# Patient Record
Sex: Male | Born: 1970 | Race: White | Hispanic: No | Marital: Single | Smoking: Current every day smoker
Health system: Southern US, Community
[De-identification: ages and names within clinical notes are randomized; demographics above are authoritative.]

## PROBLEM LIST (undated history)

## (undated) DIAGNOSIS — K219 Gastro-esophageal reflux disease without esophagitis: Secondary | ICD-10-CM

## (undated) DIAGNOSIS — E039 Hypothyroidism, unspecified: Secondary | ICD-10-CM

## (undated) DIAGNOSIS — T148XXA Other injury of unspecified body region, initial encounter: Secondary | ICD-10-CM

## (undated) DIAGNOSIS — J302 Other seasonal allergic rhinitis: Secondary | ICD-10-CM

## (undated) HISTORY — PX: NO PAST SURGERIES: SHX2092

---

## 2001-08-23 ENCOUNTER — Emergency Department (HOSPITAL_COMMUNITY): Admission: EM | Admit: 2001-08-23 | Discharge: 2001-08-23 | Payer: Self-pay | Admitting: Internal Medicine

## 2001-09-03 ENCOUNTER — Emergency Department (HOSPITAL_COMMUNITY): Admission: EM | Admit: 2001-09-03 | Discharge: 2001-09-03 | Payer: Self-pay | Admitting: Emergency Medicine

## 2001-09-03 ENCOUNTER — Encounter: Payer: Self-pay | Admitting: Emergency Medicine

## 2002-04-03 ENCOUNTER — Emergency Department (HOSPITAL_COMMUNITY): Admission: EM | Admit: 2002-04-03 | Discharge: 2002-04-03 | Payer: Self-pay | Admitting: Internal Medicine

## 2007-07-15 ENCOUNTER — Emergency Department (HOSPITAL_COMMUNITY): Admission: EM | Admit: 2007-07-15 | Discharge: 2007-07-15 | Payer: Self-pay | Admitting: *Deleted

## 2007-08-24 ENCOUNTER — Emergency Department (HOSPITAL_COMMUNITY): Admission: EM | Admit: 2007-08-24 | Discharge: 2007-08-24 | Payer: Self-pay | Admitting: Emergency Medicine

## 2007-09-18 ENCOUNTER — Emergency Department (HOSPITAL_COMMUNITY): Admission: EM | Admit: 2007-09-18 | Discharge: 2007-09-18 | Payer: Self-pay | Admitting: Emergency Medicine

## 2007-10-18 ENCOUNTER — Emergency Department (HOSPITAL_COMMUNITY): Admission: EM | Admit: 2007-10-18 | Discharge: 2007-10-18 | Payer: Self-pay | Admitting: Emergency Medicine

## 2007-11-01 ENCOUNTER — Emergency Department (HOSPITAL_COMMUNITY): Admission: EM | Admit: 2007-11-01 | Discharge: 2007-11-01 | Payer: Self-pay | Admitting: Emergency Medicine

## 2007-11-02 IMAGING — CR DG ANKLE COMPLETE 3+V*R*
3 series · 3 of 3 positions shown · non-contrast
Comparison: None.

CLINICAL DATA: Twisted right ankle, lateral pain and swelling.

RIGHT ANKLE - 3 VIEW  08/24/2007:

[view not recorded (1 of 3)]
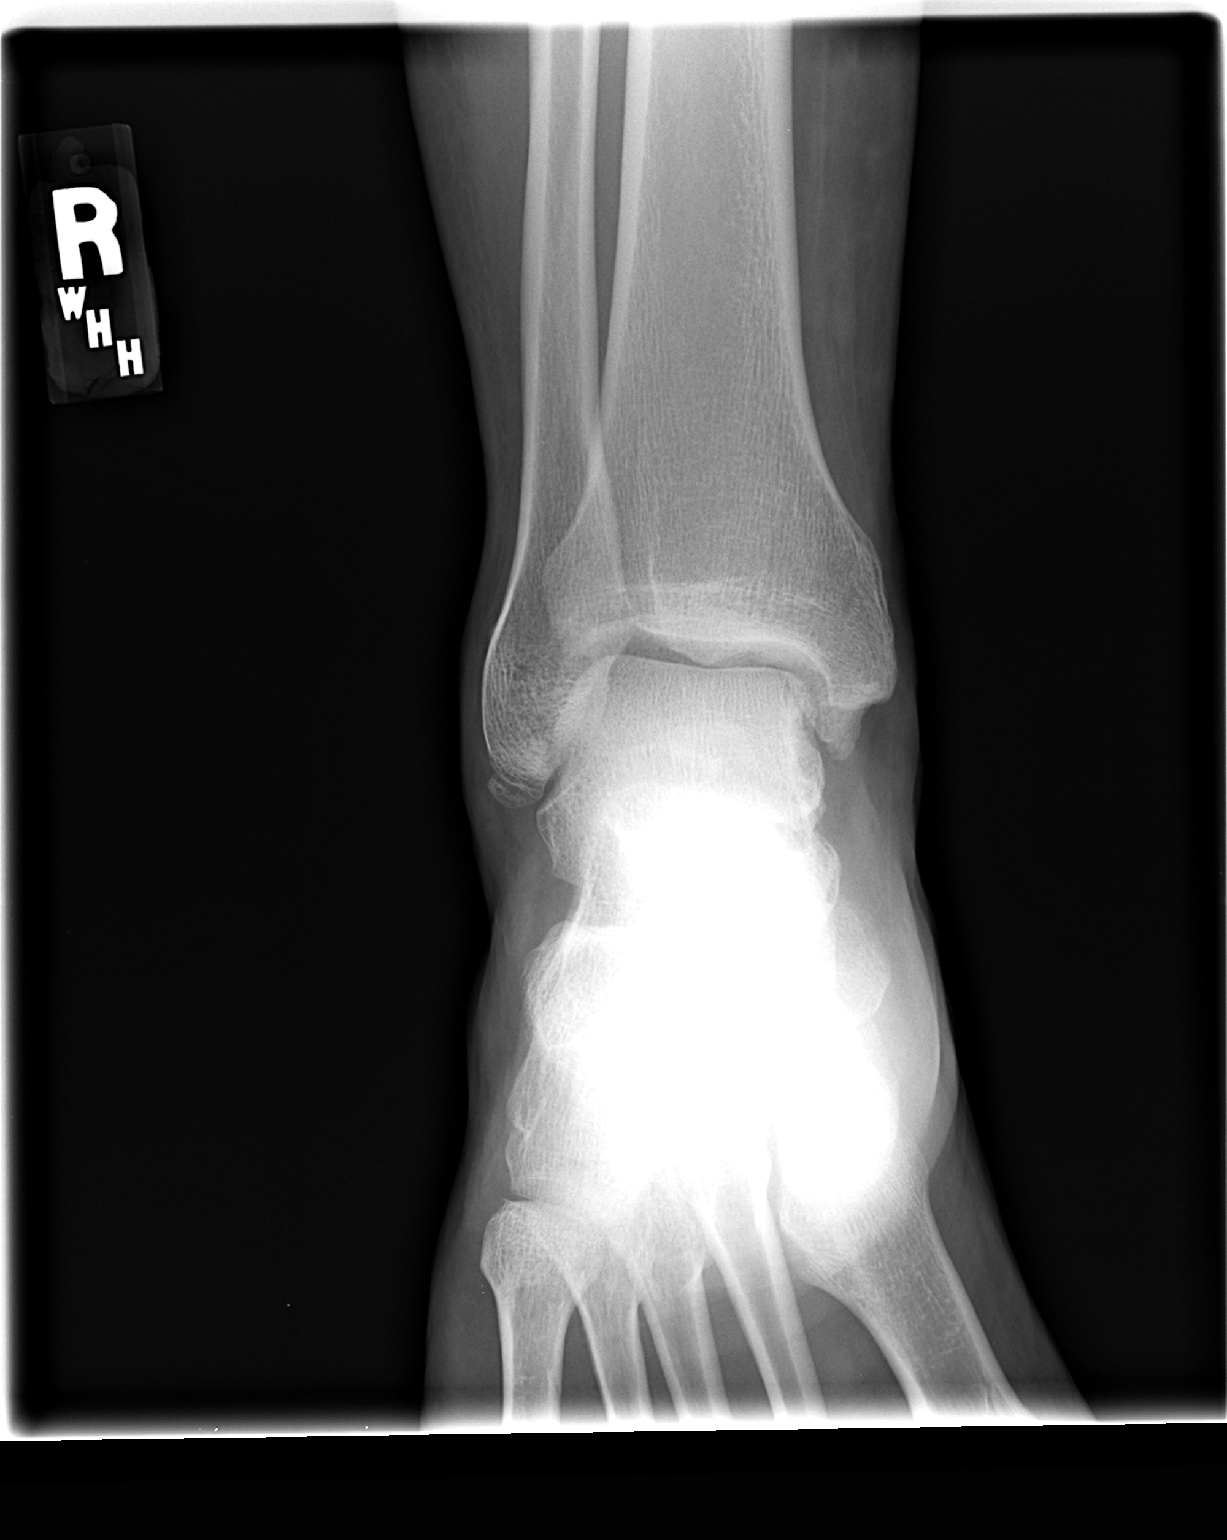

[view not recorded (2 of 3)]
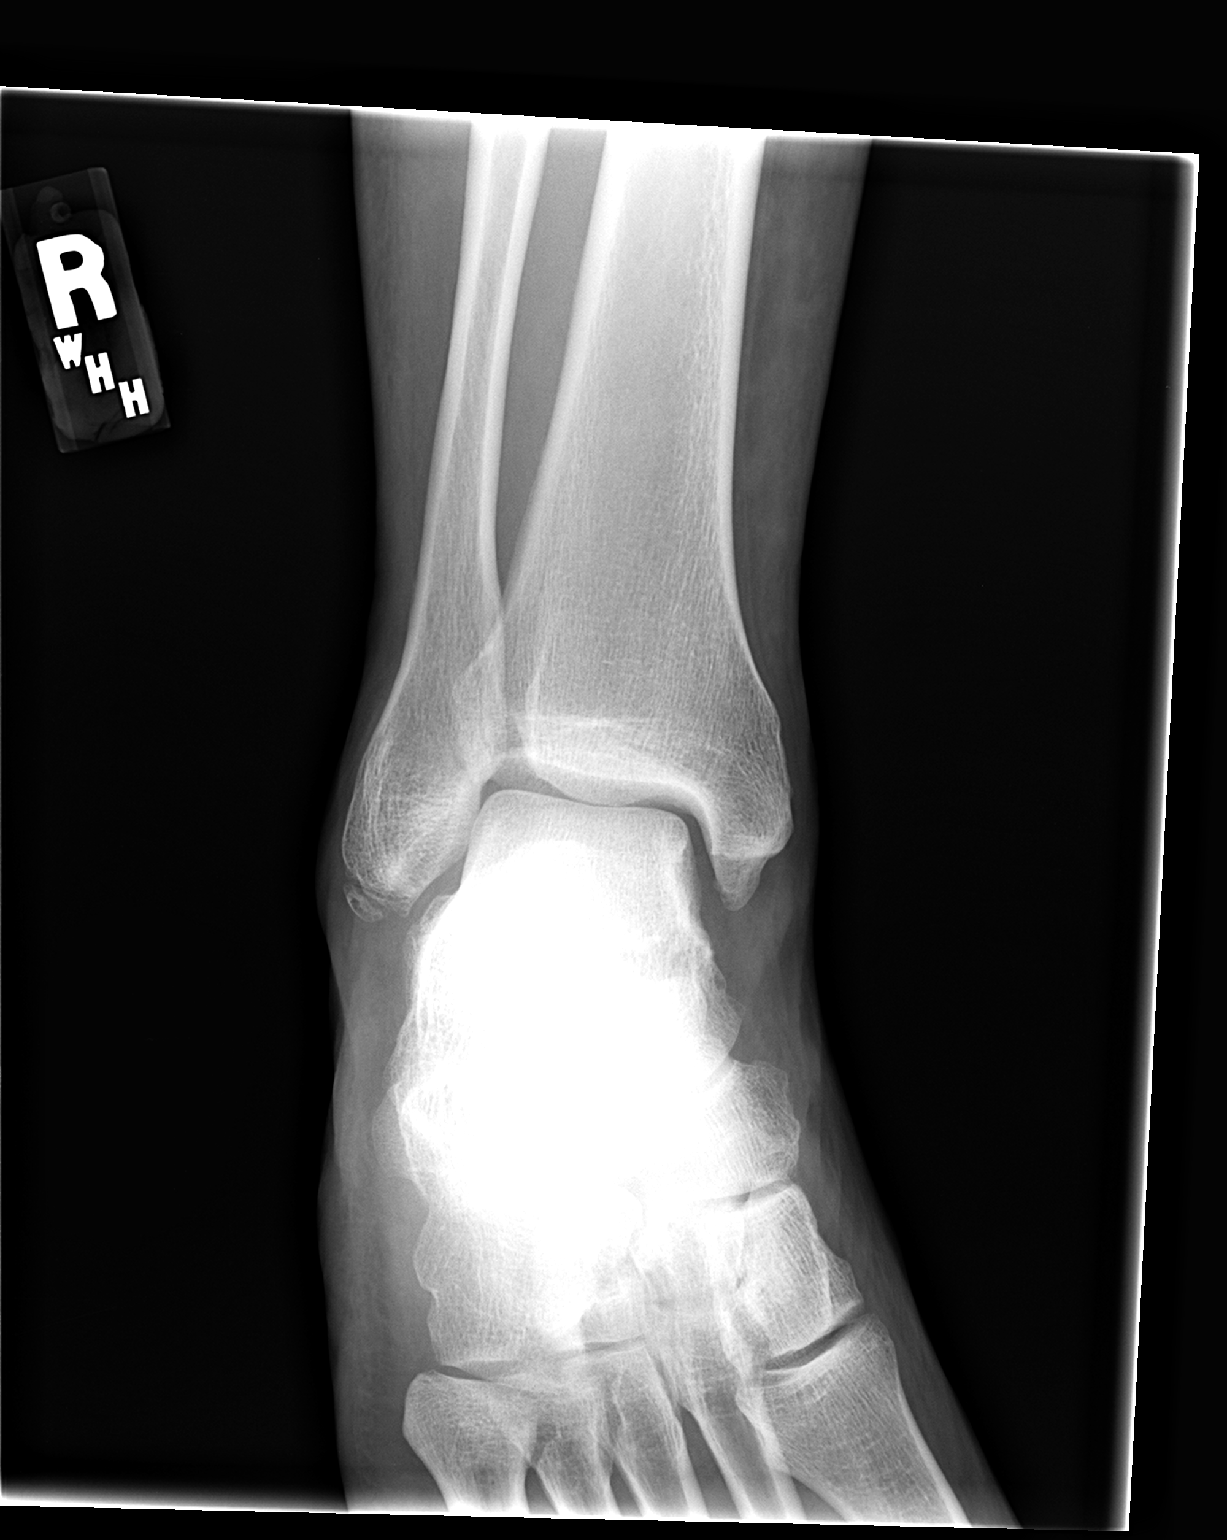

[view not recorded (3 of 3)]
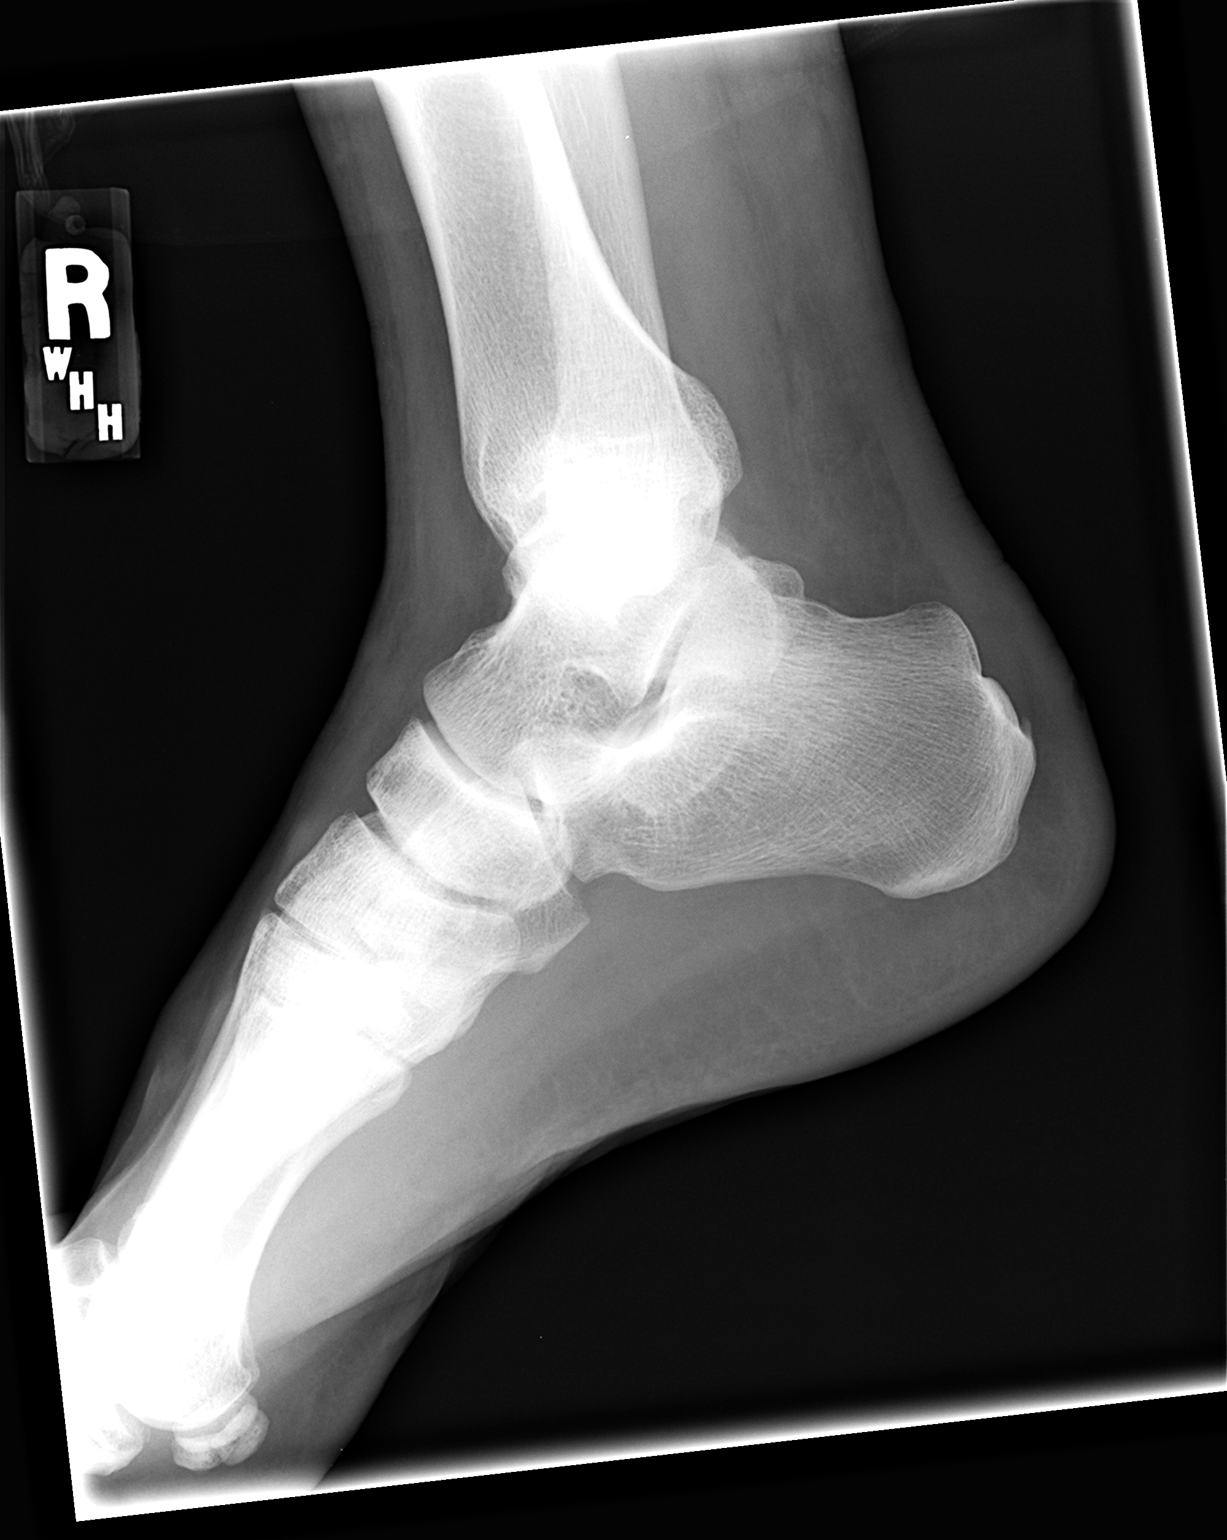

[3 of 3 positions shown; findings below may reference images not displayed]

FINDINGS: No evidence of acute fracture or dislocation. Ankle mortise intact.
Accessory ossicle adjacent to the tip of the lateral malleolus. No evidence of
joint effusion. Mild lateral soft tissue swelling.
IMPRESSION: No acute or significant skeletal abnormalities.

## 2007-11-12 ENCOUNTER — Emergency Department (HOSPITAL_COMMUNITY): Admission: EM | Admit: 2007-11-12 | Discharge: 2007-11-13 | Payer: Self-pay | Admitting: Emergency Medicine

## 2007-11-27 ENCOUNTER — Emergency Department (HOSPITAL_COMMUNITY): Admission: EM | Admit: 2007-11-27 | Discharge: 2007-11-27 | Payer: Self-pay | Admitting: Emergency Medicine

## 2009-04-04 ENCOUNTER — Emergency Department (HOSPITAL_COMMUNITY): Admission: EM | Admit: 2009-04-04 | Discharge: 2009-04-04 | Payer: Self-pay | Admitting: Emergency Medicine

## 2009-08-13 ENCOUNTER — Emergency Department (HOSPITAL_COMMUNITY): Admission: EM | Admit: 2009-08-13 | Discharge: 2009-08-13 | Payer: Self-pay | Admitting: Emergency Medicine

## 2013-04-29 ENCOUNTER — Emergency Department (HOSPITAL_COMMUNITY): Payer: Self-pay

## 2013-04-29 ENCOUNTER — Encounter (HOSPITAL_COMMUNITY): Payer: Self-pay | Admitting: *Deleted

## 2013-04-29 ENCOUNTER — Emergency Department (HOSPITAL_COMMUNITY)
Admission: EM | Admit: 2013-04-29 | Discharge: 2013-04-29 | Disposition: A | Discharge status: Home / Self Care | Payer: Self-pay | Attending: Emergency Medicine | Admitting: Emergency Medicine

## 2013-04-29 DIAGNOSIS — I861 Scrotal varices: Secondary | ICD-10-CM | POA: Insufficient documentation

## 2013-04-29 DIAGNOSIS — N508 Other specified disorders of male genital organs: Secondary | ICD-10-CM | POA: Insufficient documentation

## 2013-04-29 DIAGNOSIS — N503 Cyst of epididymis: Secondary | ICD-10-CM

## 2013-04-29 DIAGNOSIS — R339 Retention of urine, unspecified: Secondary | ICD-10-CM | POA: Insufficient documentation

## 2013-04-29 DIAGNOSIS — F17200 Nicotine dependence, unspecified, uncomplicated: Secondary | ICD-10-CM | POA: Insufficient documentation

## 2013-04-29 DIAGNOSIS — Z79899 Other long term (current) drug therapy: Secondary | ICD-10-CM | POA: Insufficient documentation

## 2013-04-29 HISTORY — DX: Other seasonal allergic rhinitis: J30.2

## 2013-04-29 LAB — URINALYSIS, ROUTINE W REFLEX MICROSCOPIC
Bilirubin Urine: NEGATIVE
Ketones, ur: NEGATIVE mg/dL
Leukocytes, UA: NEGATIVE
Nitrite: NEGATIVE
Protein, ur: NEGATIVE mg/dL
Urobilinogen, UA: 0.2 mg/dL (ref 0.0–1.0)
pH: 6 (ref 5.0–8.0)

## 2013-04-29 MED ORDER — DICLOFENAC SODIUM 75 MG PO TBEC: 75.0000 mg | DELAYED_RELEASE_TABLET | Freq: Two times a day (BID) | ORAL | Status: DC

## 2013-04-29 NOTE — ED Provider Notes (Signed)
History    CSN: [id number] Arrival date & time 04/29/13  0901  First MD Initiated Contact with Patient 04/29/13 [phone]     Chief Complaint  Patient presents with  . Testicle Pain  . Urinary Retention   (Consider location/radiation/quality/duration/timing/severity/associated sxs/prior Treatment) Patient is a 42 y.o. male presenting with testicular pain. The history is provided by the patient.  Testicle Pain This is a chronic problem. The current episode started more than 1 month ago. The problem occurs intermittently. The problem has been gradually worsening. Pertinent negatives include no fatigue, fever, joint swelling, myalgias or urinary symptoms. Nothing aggravates the symptoms. He has tried nothing for the symptoms. The treatment provided no relief.   Past Medical History  Diagnosis Date  . Seasonal allergies    History reviewed. No pertinent past surgical history. No family history on file. History  Substance Use Topics  . Smoking status: Current Every Day Smoker  . Smokeless tobacco: Not on file  . Alcohol Use: Yes     Comment: Occ    Review of Systems  Constitutional: Negative for fever and fatigue.  Genitourinary: Positive for testicular pain. Negative for dysuria, flank pain and discharge.  Musculoskeletal: Negative for myalgias and joint swelling.    Allergies  Review of patient's allergies indicates no known allergies.  Home Medications   Current Outpatient Rx  Name  Route  Sig  Dispense  Refill  . PRESCRIPTION MEDICATION   Oral   Take 1 tablet by mouth daily before breakfast. Pt states on Synthroid do not know strength. Asked pt where got [facility] and [facility]. Pt could not tell me which prison for me to call.         . ranitidine (ZANTAC) 300 MG tablet   Oral   Take 300 mg by mouth 2 (two) times daily.         . tamsulosin (FLOMAX) 0.4 MG CAPS   Oral   Take 0.4 mg by mouth daily after supper.         . tetrahydrozoline 0.05 %  ophthalmic solution   Both Eyes   Place 1 drop into both eyes 2 (two) times daily as needed (dry eyes).          BP 136/87  Pulse 104  Temp(Src) 98.4 F (36.9 C) (Oral)  Resp 16  Ht 5\' 9"  (1.753 m)  Wt 200 lb (90.719 kg)  BMI 29.52 kg/m2  SpO2 97% Physical Exam  Nursing note and vitals reviewed. Constitutional: He is oriented to person, place, and time. He appears well-developed and well-nourished.  Non-toxic appearance.  HENT:  Head: Normocephalic.  Right Ear: Tympanic membrane and external ear normal.  Left Ear: Tympanic membrane and external ear normal.  Eyes: EOM and lids are normal. Pupils are equal, round, and reactive to light.  Neck: Normal range of motion. Neck supple. Carotid bruit is not present.  Cardiovascular: Normal rate, regular rhythm, normal heart sounds, intact distal pulses and normal pulses.   Pulmonary/Chest: Breath sounds normal. No respiratory distress.  Abdominal: Soft. Bowel sounds are normal. There is no tenderness. There is no guarding.  Genitourinary:  There is a cyst/mass at the base of the penis extending into the upper part of the scrotum. Mild to moderate tenderness noted. The patient is uncircumcised. There is no drainage or discharge from the penis. Is no rash of the shaft of the penis. The right testicle is sore but not red or swollen.  Musculoskeletal: Normal range of motion.  Lymphadenopathy:       Head (right side): No submandibular adenopathy present.       Head (left side): No submandibular adenopathy present.    He has no cervical adenopathy.  Neurological: He is alert and oriented to person, place, and time. He has normal strength. No cranial nerve deficit or sensory deficit.  Skin: Skin is warm and dry.  Psychiatric: He has a normal mood and affect. His speech is normal.    ED Course  Procedures (including critical care time) Labs Reviewed  URINALYSIS, ROUTINE W REFLEX MICROSCOPIC - Abnormal; Notable for the following:    Hgb  urine dipstick TRACE (*)    All other components within normal limits  URINE MICROSCOPIC-ADD ON - Abnormal; Notable for the following:    Bacteria, UA FEW (*)    All other components within normal limits   US Scrotum  04/29/2013   *RADIOLOGY REPORT*  Clinical Data:  Testicular pain and urinary retention  SCROTAL ULTRASOUND DOPPLER ULTRASOUND OF THE TESTICLES  Technique: Complete ultrasound examination of the testicles, epididymis, and other scrotal structures was performed.  Color and spectral Doppler ultrasound were also utilized to evaluate blood flow to the testicles.  Comparison:  Findings:  Right testis:  Normal in size and homogeneous echotexture measuring 3.3 x 2.4 x 2.9 cm  Left testis:  Normal in size and homogeneous in echotexture measuring 3.2 x 2.0 x 3.0 cm.  Right epididymis:  There are multiple cysts within the right epididymis, the largest measuring 7 mm.  Normal blood flow.  Left epididymis:  Normal  Hydrocele:  Small right hydrocele.  Varicocele:  No varicocele  Pulsed Doppler interrogation of both testes demonstrates low resistance flow bilaterally.  IMPRESSION:  1.  Normal testicles with normal arterial and venous flow. 2.  Small benign right epididymal cysts.   Original Report Authenticated By: Genevive Bi, M.D.   [place] Art/ven Flow Abd Pelv Doppler  04/29/2013   *RADIOLOGY REPORT*  Clinical Data:  Testicular pain and urinary retention  SCROTAL ULTRASOUND DOPPLER ULTRASOUND OF THE TESTICLES  Technique: Complete ultrasound examination of the testicles, epididymis, and other scrotal structures was performed.  Color and spectral Doppler ultrasound were also utilized to evaluate blood flow to the testicles.  Comparison:  Findings:  Right testis:  Normal in size and homogeneous echotexture measuring 3.3 x 2.4 x 2.9 cm  Left testis:  Normal in size and homogeneous in echotexture measuring 3.2 x 2.0 x 3.0 cm.  Right epididymis:  There are multiple cysts within the right epididymis, the  largest measuring 7 mm.  Normal blood flow.  Left epididymis:  Normal  Hydrocele:  Small right hydrocele.  Varicocele:  No varicocele  Pulsed Doppler interrogation of both testes demonstrates low resistance flow bilaterally.  IMPRESSION:  1.  Normal testicles with normal arterial and venous flow. 2.  Small benign right epididymal cysts.   Original Report Authenticated By: Genevive Bi, M.D.   No diagnosis found.  MDM  I have reviewed nursing notes, vital signs, and all appropriate lab and imaging results for this patient.   Urinalysis is well within normal limits. Vital signs are stable. Ultrasound of the scrotum reveals normal testes with the arterial and venous flow. There is a small right epididymal cyst and right varicocele.  The patient is advised of the findings. He is advised to see Dr. Darrelyn Hillock with [facility] here in [place].  Kathie Dike, PA-C 04/29/13 1304

## 2013-04-29 NOTE — ED Notes (Addendum)
Pt states knot to right testicle with retention. Pt states "I have to really push to urinate sometimes" Seen by physician a year ago and put on Flomax. Pt did not follow up. Pt states "It comes and goes"

## 2013-04-29 NOTE — ED Provider Notes (Signed)
Medical screening examination/treatment/procedure(s) were performed by non-physician practitioner and as supervising physician I was immediately available for consultation/collaboration.   Shelda Jakes, MD 04/29/13 [phone]

## 2013-05-04 ENCOUNTER — Encounter (HOSPITAL_COMMUNITY): Payer: Self-pay | Admitting: *Deleted

## 2013-05-04 ENCOUNTER — Emergency Department (HOSPITAL_COMMUNITY)
Admission: EM | Admit: 2013-05-04 | Discharge: 2013-05-04 | Disposition: A | Discharge status: Home / Self Care | Payer: Self-pay | Attending: Emergency Medicine | Admitting: Emergency Medicine

## 2013-05-04 DIAGNOSIS — F17200 Nicotine dependence, unspecified, uncomplicated: Secondary | ICD-10-CM | POA: Insufficient documentation

## 2013-05-04 DIAGNOSIS — R300 Dysuria: Secondary | ICD-10-CM | POA: Insufficient documentation

## 2013-05-04 DIAGNOSIS — N50819 Testicular pain, unspecified: Secondary | ICD-10-CM

## 2013-05-04 DIAGNOSIS — K219 Gastro-esophageal reflux disease without esophagitis: Secondary | ICD-10-CM | POA: Insufficient documentation

## 2013-05-04 DIAGNOSIS — N509 Disorder of male genital organs, unspecified: Secondary | ICD-10-CM | POA: Insufficient documentation

## 2013-05-04 DIAGNOSIS — Z79899 Other long term (current) drug therapy: Secondary | ICD-10-CM | POA: Insufficient documentation

## 2013-05-04 HISTORY — DX: Gastro-esophageal reflux disease without esophagitis: K21.9

## 2013-05-04 LAB — URINALYSIS, ROUTINE W REFLEX MICROSCOPIC
Bilirubin Urine: NEGATIVE
Glucose, UA: NEGATIVE mg/dL
Hgb urine dipstick: NEGATIVE
Ketones, ur: NEGATIVE mg/dL
Leukocytes, UA: NEGATIVE
Protein, ur: NEGATIVE mg/dL
pH: 6 (ref 5.0–8.0)

## 2013-05-04 MED ORDER — DOXYCYCLINE HYCLATE 100 MG PO CAPS: 100.0000 mg | ORAL_CAPSULE | Freq: Two times a day (BID) | ORAL | Status: DC

## 2013-05-04 MED ORDER — HYDROCODONE-ACETAMINOPHEN 5-325 MG PO TABS
1.0000 | ORAL_TABLET | Freq: Once | ORAL | Status: AC
  Administered 2013-05-04: 1 via ORAL
  Filled 2013-05-04: qty 1

## 2013-05-04 MED ORDER — NAPROXEN 500 MG PO TABS: 500.0000 mg | ORAL_TABLET | Freq: Two times a day (BID) | ORAL | Status: DC

## 2013-05-04 NOTE — ED Notes (Signed)
Pt with right testicle pain off and on for an hour per pt, hx of same in past

## 2013-05-04 NOTE — ED Provider Notes (Signed)
History    CSN: [id number] Arrival date & time 05/04/13  0157  First MD Initiated Contact with Patient 05/04/13 0231     Chief Complaint  Patient presents with  . Testicle Pain   (Consider location/radiation/quality/duration/timing/severity/associated sxs/prior Treatment) HPI HPI Comments: Troy Robbins is a 42 y.o. male who presents to the Emergency Department complaining of  Right testicular pain that has been present for an hour tonight. He has had pain for a month. He was seen here on 04/29/13 and had an US done which showed a small right epididymal cyst only. Normal flow. He denies fever, chills, nausea, vomiting. He states he is having difficulty on occasion urinating. He has been referred to Dr. Janifer Adie, urologist.   Past Medical History  Diagnosis Date  . Seasonal allergies   . Acid reflux    History reviewed. No pertinent past surgical history. History reviewed. No pertinent family history. History  Substance Use Topics  . Smoking status: Current Every Day Smoker -- 1.00 packs/day    Types: Cigarettes  . Smokeless tobacco: Not on file  . Alcohol Use: Yes     Comment: Occ    Review of Systems  Constitutional: Negative for fever.       10 Systems reviewed and are negative for acute change except as noted in the HPI.  HENT: Negative for congestion.   Eyes: Negative for discharge and redness.  Respiratory: Negative for cough and shortness of breath.   Cardiovascular: Negative for chest pain.  Gastrointestinal: Negative for vomiting and abdominal pain.  Genitourinary: Positive for testicular pain.  Musculoskeletal: Negative for back pain.  Skin: Negative for rash.  Neurological: Negative for syncope, numbness and headaches.  Psychiatric/Behavioral:       No behavior change.    Allergies  Review of patient's allergies indicates no known allergies.  Home Medications   Current Outpatient Rx  Name  Route  Sig  Dispense  Refill  . diclofenac (VOLTAREN) 75  MG EC tablet   Oral   Take 1 tablet (75 mg total) by mouth 2 (two) times daily.   12 tablet   0   . PRESCRIPTION MEDICATION   Oral   Take 1 tablet by mouth daily before breakfast. Pt states on Synthroid do not know strength. Asked pt where got [facility] and he [facility]. Pt could not tell me which prison for me to call.         . ranitidine (ZANTAC) 300 MG tablet   Oral   Take 300 mg by mouth 2 (two) times daily.         . tamsulosin (FLOMAX) 0.4 MG CAPS   Oral   Take 0.4 mg by mouth daily after supper.         . tetrahydrozoline 0.05 % ophthalmic solution   Both Eyes   Place 1 drop into both eyes 2 (two) times daily as needed (dry eyes).          BP 129/75  Pulse 100  Temp(Src) 97.8 F (36.6 C) (Oral)  Resp 16  Ht 5\' 9"  (1.753 m)  Wt 200 lb (90.719 kg)  BMI 29.52 kg/m2  SpO2 96% Physical Exam  Nursing note and vitals reviewed. Constitutional: He appears well-developed and well-nourished.  Awake, alert, nontoxic appearance.  HENT:  Head: Normocephalic and atraumatic.  Eyes: EOM are normal. Pupils are equal, round, and reactive to light.  Neck: Normal range of motion. Neck supple.  Cardiovascular: Normal rate and intact distal pulses.  Pulmonary/Chest: Effort normal and breath sounds normal. He exhibits no tenderness.  Abdominal: Soft. Bowel sounds are normal. There is no tenderness. There is no rebound.  Genitourinary:  Genital  exam performed with pt permission and male ED Tech chaparone present during exam.  Pt examined laying and standing.  No perineal erythema.  No penile lesions or drainage. Patient is not circumcised.  No scrotal erythema, edema or tenderness to palp.  Normal testicular lie.  No lefttesticular tenderness to palp.  Mild right testicular tenderness to palpation.  Musculoskeletal: He exhibits no tenderness.  Baseline ROM, no obvious new focal weakness.  Neurological:  Mental status and motor strength appears baseline for patient and  situation.  Skin: No rash noted.  Psychiatric: He has a normal mood and affect.    ED Course  Procedures (including critical care time) Results for orders placed during the hospital encounter of 05/04/13  URINALYSIS, ROUTINE W REFLEX MICROSCOPIC      Result Value Range   Color, Urine YELLOW  YELLOW   APPearance CLEAR  CLEAR   Specific Gravity, Urine <1.005 (*) 1.005 - 1.030   pH 6.0  5.0 - 8.0   Glucose, UA NEGATIVE  NEGATIVE mg/dL   Hgb urine dipstick NEGATIVE  NEGATIVE   Bilirubin Urine NEGATIVE  NEGATIVE   Ketones, ur NEGATIVE  NEGATIVE mg/dL   Protein, ur NEGATIVE  NEGATIVE mg/dL   Urobilinogen, UA 0.2  0.0 - 1.0 mg/dL   Nitrite NEGATIVE  NEGATIVE   Leukocytes, UA NEGATIVE  NEGATIVE    MDM  Patient with recurrent right testicular pain. Has had an US done on 04/29/13 which was normal. Given hydrocodone. Will treat with doxycycline. Referral to Dr. Janifer Adie. Pt stable in ED with no significant deterioration in condition.The patient appears reasonably screened and/or stabilized for discharge and I doubt any other medical condition or other [facility] requiring further screening, evaluation, or treatment in the ED at this time prior to discharge.  MDM Reviewed: nursing note, vitals and previous chart Reviewed previous: ultrasound and labs Interpretation: labs     Nicoletta Dress. Colon Branch, MD 05/04/13 [phone]

## 2013-06-29 ENCOUNTER — Encounter (HOSPITAL_COMMUNITY): Payer: Self-pay

## 2013-06-29 ENCOUNTER — Emergency Department (HOSPITAL_COMMUNITY)
Admission: EM | Admit: 2013-06-29 | Discharge: 2013-06-29 | Disposition: A | Discharge status: Home / Self Care | Payer: Self-pay | Attending: Emergency Medicine | Admitting: Emergency Medicine

## 2013-06-29 DIAGNOSIS — F17200 Nicotine dependence, unspecified, uncomplicated: Secondary | ICD-10-CM | POA: Insufficient documentation

## 2013-06-29 DIAGNOSIS — B356 Tinea cruris: Secondary | ICD-10-CM | POA: Insufficient documentation

## 2013-06-29 DIAGNOSIS — Z8719 Personal history of other diseases of the digestive system: Secondary | ICD-10-CM | POA: Insufficient documentation

## 2013-06-29 DIAGNOSIS — L299 Pruritus, unspecified: Secondary | ICD-10-CM | POA: Insufficient documentation

## 2013-06-29 MED ORDER — NYSTATIN-TRIAMCINOLONE 100000-0.1 UNIT/GM-% EX CREA
TOPICAL_CREAM | Freq: Two times a day (BID) | CUTANEOUS | Status: DC
  Filled 2013-06-29: qty 15

## 2013-06-29 NOTE — ED Notes (Signed)
Awaiting medication from pharmacy prior to discharge.

## 2013-06-29 NOTE — ED Notes (Signed)
Rash to groin area for 2 days

## 2013-06-29 NOTE — ED Notes (Signed)
Patient with no complaints at this time. Respirations even and unlabored. Skin warm/dry. Discharge instructions reviewed with patient at this time. Patient given opportunity to voice concerns/ask questions. Patient discharged at this time and left Emergency Department with steady gait.   

## 2013-07-01 ENCOUNTER — Ambulatory Visit: Payer: Self-pay | Admitting: Urology

## 2013-07-04 NOTE — ED Provider Notes (Signed)
CSN: [id number]     Arrival date & time 06/29/13  1201 History   First MD Initiated Contact with Patient 06/29/13 1317     Chief Complaint  Patient presents with  . Rash   (Consider location/radiation/quality/duration/timing/severity/associated sxs/prior Treatment) HPI Comments: Troy Robbins is a 42 y.o. Male presenting with a pruritic rash to his groin which has been present for about the past 2 days.  He denies fevers or chills, denies swelling, pain or drainage from the rash. Additionally he has had no penile discharge and denies similar rash in his male partner.  He has tried no medicines prior to arrival, although works outdoors and has tried to keep his groin dry using powder..  Review of the chart noted complaint of testicular pain 6/14 which he denies today.  He has not followed up with urology as previously referred.       The history is provided by the patient.    Past Medical History  Diagnosis Date  . Seasonal allergies   . Acid reflux    History reviewed. No pertinent past surgical history. No family history on file. History  Substance Use Topics  . Smoking status: Current Every Day Smoker -- 1.00 packs/day    Types: Cigarettes  . Smokeless tobacco: Not on file  . Alcohol Use: Yes     Comment: Occ    Review of Systems  Constitutional: Negative for fever and chills.  HENT: Negative for facial swelling.   Respiratory: Negative for shortness of breath and wheezing.   Genitourinary: Negative for hematuria, discharge, scrotal swelling, difficulty urinating, penile pain and testicular pain.  Skin: Positive for rash.  Neurological: Negative for numbness.    Allergies  Review of patient's allergies indicates no known allergies.  Home Medications  No current outpatient prescriptions on file. BP 129/73  Pulse 98  Temp(Src) 98.2 F (36.8 C) (Oral)  Resp 17  Ht 5\' 9"  (1.753 m)  Wt 170 lb (77.111 kg)  BMI 25.09 kg/m2  SpO2 98% Physical Exam   Constitutional: He appears well-developed and well-nourished. No distress.  HENT:  Head: Normocephalic.  Neck: Neck supple.  Cardiovascular: Normal rate.   Pulmonary/Chest: Effort normal. He has no wheezes.  Musculoskeletal: Normal range of motion. He exhibits no edema.  Skin: Rash noted.  Diffuse erythematous macular rash with scaly border involving both groin and scrotum.  Excoriations without drainage.  Several satellite lesions present.  Chaperone was present during exam.      ED Course  Procedures (including critical care time) Labs Review Labs Reviewed - No data to display Imaging Review No results found.  MDM   1. Tinea cruris    Pt was given nystatin-triamcinolone cream - advised soap and water wash followed by cream bid.  Keep area dry.  Referrals given for establishing pcp.    Burgess Amor, PA-C 07/04/13 [phone]

## 2013-07-05 NOTE — ED Provider Notes (Signed)
Medical screening examination/treatment/procedure(s) were performed by non-physician practitioner and as supervising physician I was immediately available for consultation/collaboration. Devoria Albe, MD, [facility]   Ward Givens, MD 07/05/13 [phone]

## 2013-07-19 ENCOUNTER — Encounter (HOSPITAL_COMMUNITY): Payer: Self-pay

## 2013-07-19 ENCOUNTER — Emergency Department (HOSPITAL_COMMUNITY)
Admission: EM | Admit: 2013-07-19 | Discharge: 2013-07-19 | Disposition: A | Discharge status: Home / Self Care | Payer: Self-pay | Attending: Emergency Medicine | Admitting: Emergency Medicine

## 2013-07-19 DIAGNOSIS — Z8719 Personal history of other diseases of the digestive system: Secondary | ICD-10-CM | POA: Insufficient documentation

## 2013-07-19 DIAGNOSIS — F17200 Nicotine dependence, unspecified, uncomplicated: Secondary | ICD-10-CM | POA: Insufficient documentation

## 2013-07-19 DIAGNOSIS — Z202 Contact with and (suspected) exposure to infections with a predominantly sexual mode of transmission: Secondary | ICD-10-CM | POA: Insufficient documentation

## 2013-07-19 DIAGNOSIS — Z8709 Personal history of other diseases of the respiratory system: Secondary | ICD-10-CM | POA: Insufficient documentation

## 2013-07-19 LAB — HIV ANTIBODY (ROUTINE TESTING W REFLEX): HIV: NONREACTIVE

## 2013-07-19 MED ORDER — AZITHROMYCIN 1 G PO PACK
1.0000 g | PACK | Freq: Once | ORAL | Status: AC
  Administered 2013-07-19: 1 g via ORAL
  Filled 2013-07-19: qty 1

## 2013-07-19 MED ORDER — CEFTRIAXONE SODIUM 250 MG IJ SOLR
250.0000 mg | Freq: Once | INTRAMUSCULAR | Status: AC
  Administered 2013-07-19: 250 mg via INTRAMUSCULAR
  Filled 2013-07-19: qty 250

## 2013-07-19 MED ORDER — LIDOCAINE HCL (PF) 1 % IJ SOLN
INTRAMUSCULAR | Status: AC
  Administered 2013-07-19: 5 mL
  Filled 2013-07-19: qty 5

## 2013-07-19 NOTE — ED Provider Notes (Signed)
CSN: [id number]     Arrival date & time 07/19/13  0138 History   First MD Initiated Contact with Patient 07/19/13 0244     Chief complaint: Possible exposure to STD  (Consider location/radiation/quality/duration/timing/severity/associated sxs/prior Treatment) The history is provided by the patient.   42 year old male states that he had sex for intercourse with a woman yesterday and then heard rumors that she may have an STD so he came in to be checked. He denies urethral discharge, dysuria, and genital pain, abdominal pain. He states that he did not use a condom with this encounter but that he usually does use a condom.  Past Medical History  Diagnosis Date  . Seasonal allergies   . Acid reflux    History reviewed. No pertinent past surgical history. No family history on file. History  Substance Use Topics  . Smoking status: Current Every Day Smoker -- 1.00 packs/day    Types: Cigarettes  . Smokeless tobacco: Not on file  . Alcohol Use: Yes     Comment: Occ    Review of Systems  All other systems reviewed and are negative.    Allergies  Review of patient's allergies indicates no known allergies.  Home Medications  No current outpatient prescriptions on file. BP 126/68  Pulse 96  Temp(Src) 98.7 F (37.1 C)  Resp 20  Ht 5\' 9"  (1.753 m)  Wt 170 lb (77.111 kg)  BMI 25.09 kg/m2  SpO2 95% Physical Exam  Nursing note and vitals reviewed.  42 year old male, resting comfortably and in no acute distress. Vital signs are normal. Oxygen saturation is 95%, which is normal. Head is normocephalic and atraumatic. PERRLA, EOMI. Oropharynx is clear. Neck is nontender and supple without adenopathy or JVD. Back is nontender and there is no CVA tenderness. Lungs are clear without rales, wheezes, or rhonchi. Chest is nontender. Heart has regular rate and rhythm without murmur. Abdomen is soft, flat, nontender without masses or hepatosplenomegaly and peristalsis is  normoactive. Genitalia: Uncircumcised penis, testes and without masses, no urethral discharge, no inguinal adenopathy. Extremities have no cyanosis or edema, full range of motion is present. Skin is warm and dry without rash. Neurologic: Mental status is normal, cranial nerves are intact, there are no motor or sensory deficits. Psychiatric: Flat affect. He makes very poor eye contact.  ED Course  Procedures (including critical care time)  MDM   1. Possible exposure to STD    Possible exposure to STD. STD panel has been drawn and he is empirically treated with ceftriaxone and azithromycin. He is discharged with instructions on safe sex.    Dione Booze, MD 07/19/13 [phone]

## 2013-07-20 LAB — GC/CHLAMYDIA PROBE AMP: GC Probe RNA: NEGATIVE

## 2013-07-29 ENCOUNTER — Ambulatory Visit (INDEPENDENT_AMBULATORY_CARE_PROVIDER_SITE_OTHER): Payer: Self-pay | Admitting: Urology

## 2013-07-29 DIAGNOSIS — R3915 Urgency of urination: Secondary | ICD-10-CM

## 2013-07-29 DIAGNOSIS — R350 Frequency of micturition: Secondary | ICD-10-CM

## 2013-07-29 DIAGNOSIS — R39198 Other difficulties with micturition: Secondary | ICD-10-CM

## 2013-08-08 ENCOUNTER — Encounter (HOSPITAL_COMMUNITY): Payer: Self-pay | Admitting: *Deleted

## 2013-08-08 ENCOUNTER — Emergency Department (HOSPITAL_COMMUNITY)
Admission: EM | Admit: 2013-08-08 | Discharge: 2013-08-08 | Disposition: A | Discharge status: Home / Self Care | Payer: Self-pay | Attending: Emergency Medicine | Admitting: Emergency Medicine

## 2013-08-08 DIAGNOSIS — F17200 Nicotine dependence, unspecified, uncomplicated: Secondary | ICD-10-CM | POA: Insufficient documentation

## 2013-08-08 DIAGNOSIS — Z8719 Personal history of other diseases of the digestive system: Secondary | ICD-10-CM | POA: Insufficient documentation

## 2013-08-08 DIAGNOSIS — N509 Disorder of male genital organs, unspecified: Secondary | ICD-10-CM | POA: Insufficient documentation

## 2013-08-08 DIAGNOSIS — Z202 Contact with and (suspected) exposure to infections with a predominantly sexual mode of transmission: Secondary | ICD-10-CM | POA: Insufficient documentation

## 2013-08-08 NOTE — ED Provider Notes (Signed)
CSN: [id number]     Arrival date & time 08/08/13  4098 History   First MD Initiated Contact with Patient 08/08/13 0450     Chief Complaint  Patient presents with  . Testicle Pain   (Consider location/radiation/quality/duration/timing/severity/associated sxs/prior Treatment) HPI This is a 42 year old male who had several visits to the ED in the past year for testicular pain. The pain is intermittent. He has had an ultrasound which showed a varicocele. He states he needs a urology referral although he has been given multiple urology referrals in the past. He has difficulty characterizing the pain but it is mild to moderate and worse with palpation or movement.  He also states his reason for being here is that he had sexual intercourse with a woman yesterday during which the condom broke. He states the woman is known to him, not a prostitute. He is not having any symptoms. Specifically he denies dysuria, penile discharge or rash.  Past Medical History  Diagnosis Date  . Seasonal allergies   . Acid reflux    History reviewed. No pertinent past surgical history. History reviewed. No pertinent family history. History  Substance Use Topics  . Smoking status: Current Every Day Smoker -- 1.00 packs/day    Types: Cigarettes  . Smokeless tobacco: Not on file  . Alcohol Use: Yes     Comment: Occ    Review of Systems  All other systems reviewed and are negative.    Allergies  Review of patient's allergies indicates no known allergies.  Home Medications  No current outpatient prescriptions on file. BP 121/85  Pulse 89  Temp(Src) 98 F (36.7 C) (Oral)  Resp 16  Ht 5\' 9"  (1.753 m)  Wt 170 lb (77.111 kg)  BMI 25.09 kg/m2  SpO2 96%  Physical Exam General: Well-developed, well-nourished male in no acute distress; appearance consistent with age of record HENT: normocephalic; atraumatic; breath smells of alcohol Eyes: pupils equal, round and reactive to light; extraocular muscles  intact Neck: supple Heart: regular rate and rhythm Lungs: clear to auscultation bilaterally Abdomen: soft; nondistended; nontender; no masses or hepatosplenomegaly; bowel sounds present GU: Tanner 4 male uncircumcised; no urethral discharge; testicles without mass but mild tenderness over right; right inguinal tenderness without hernia palpated Extremities: No deformity; full range of motion Neurologic: Awake, alert and oriented; motor function intact in all extremities and symmetric; no facial droop Skin: Warm and dry Psychiatric: Flat affect; voice without inflection; poor eye contact    ED Course  Procedures (including critical care time)  MDM  Patient was swabbed for gonorrhea and chlamydia. He was advised that he will be informed should this show positive. We will refer him to urology. Treatment with a narcotic analgesic is not appropriate at this time as the patient appears intoxicated.    Hanley Seamen, MD 08/08/13 [phone]

## 2013-08-08 NOTE — ED Notes (Signed)
Pt reporting pain in right testicle for about 1 year.  Also requesting STD check.

## 2013-08-08 NOTE — ED Notes (Signed)
Pt angry that he isn't being given pain medications and a prescription.  Pt refused to review discharge instructions or sign e-signature pad.

## 2013-08-09 LAB — GC/CHLAMYDIA PROBE AMP: CT Probe RNA: NEGATIVE

## 2013-08-26 ENCOUNTER — Ambulatory Visit (INDEPENDENT_AMBULATORY_CARE_PROVIDER_SITE_OTHER): Payer: Self-pay | Admitting: Urology

## 2013-08-26 ENCOUNTER — Encounter (INDEPENDENT_AMBULATORY_CARE_PROVIDER_SITE_OTHER): Payer: Self-pay

## 2013-08-26 DIAGNOSIS — R39198 Other difficulties with micturition: Secondary | ICD-10-CM

## 2014-06-23 ENCOUNTER — Emergency Department (HOSPITAL_COMMUNITY)
Admission: EM | Admit: 2014-06-23 | Discharge: 2014-06-23 | Disposition: A | Discharge status: Home / Self Care | Payer: Self-pay | Attending: Emergency Medicine | Admitting: Emergency Medicine

## 2014-06-23 ENCOUNTER — Encounter (HOSPITAL_COMMUNITY): Payer: Self-pay | Admitting: Emergency Medicine

## 2014-06-23 DIAGNOSIS — Z8719 Personal history of other diseases of the digestive system: Secondary | ICD-10-CM | POA: Insufficient documentation

## 2014-06-23 DIAGNOSIS — K0889 Other specified disorders of teeth and supporting structures: Secondary | ICD-10-CM

## 2014-06-23 DIAGNOSIS — Z8709 Personal history of other diseases of the respiratory system: Secondary | ICD-10-CM | POA: Insufficient documentation

## 2014-06-23 DIAGNOSIS — K089 Disorder of teeth and supporting structures, unspecified: Secondary | ICD-10-CM | POA: Insufficient documentation

## 2014-06-23 DIAGNOSIS — K029 Dental caries, unspecified: Secondary | ICD-10-CM | POA: Insufficient documentation

## 2014-06-23 DIAGNOSIS — F17200 Nicotine dependence, unspecified, uncomplicated: Secondary | ICD-10-CM | POA: Insufficient documentation

## 2014-06-23 MED ORDER — TRAMADOL HCL 50 MG PO TABS: 50.0000 mg | ORAL_TABLET | Freq: Four times a day (QID) | ORAL | Status: DC | PRN

## 2014-06-23 MED ORDER — AMOXICILLIN 250 MG PO CAPS
500.0000 mg | ORAL_CAPSULE | Freq: Once | ORAL | Status: AC
  Administered 2014-06-23: 500 mg via ORAL
  Filled 2014-06-23: qty 2

## 2014-06-23 MED ORDER — OXYCODONE-ACETAMINOPHEN 5-325 MG PO TABS
1.0000 | ORAL_TABLET | Freq: Once | ORAL | Status: AC
Start: 2014-06-23 — End: 2014-06-23
  Administered 2014-06-23: 1 via ORAL
  Filled 2014-06-23: qty 1

## 2014-06-23 MED ORDER — AMOXICILLIN 500 MG PO CAPS: 500.0000 mg | ORAL_CAPSULE | Freq: Three times a day (TID) | ORAL | Status: DC

## 2014-06-23 NOTE — Discharge Instructions (Signed)

## 2014-06-23 NOTE — ED Notes (Signed)
Pt states that he had a left front lower tooth pulled on 06/17/2014, has had tremendous pain since then, has not been able to contact dentist that pulled the tooth,
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
## 2014-06-25 NOTE — ED Provider Notes (Signed)
Medical screening examination/treatment/procedure(s) were performed by non-physician practitioner and as supervising physician I was immediately available for consultation/collaboration.   EKG Interpretation None        Merryl Hacker, MD 06/25/14 1254

## 2015-05-27 ENCOUNTER — Encounter (HOSPITAL_COMMUNITY): Admission: EM | Disposition: A | Discharge status: Home / Self Care | Payer: Self-pay | Source: Home / Self Care

## 2015-05-27 ENCOUNTER — Emergency Department (HOSPITAL_COMMUNITY): Payer: Self-pay

## 2015-05-27 ENCOUNTER — Emergency Department (HOSPITAL_COMMUNITY): Payer: MEDICAID

## 2015-05-27 ENCOUNTER — Inpatient Hospital Stay (HOSPITAL_COMMUNITY): Payer: Self-pay

## 2015-05-27 ENCOUNTER — Inpatient Hospital Stay (HOSPITAL_COMMUNITY)
Admission: EM | Admit: 2015-05-27 | Discharge: 2015-06-02 | DRG: 208 | Disposition: A | Discharge status: Home / Self Care | Payer: Self-pay | Attending: General Surgery | Admitting: General Surgery

## 2015-05-27 DIAGNOSIS — S31115A Laceration without foreign body of abdominal wall, periumbilic region without penetration into peritoneal cavity, initial encounter: Secondary | ICD-10-CM | POA: Diagnosis present

## 2015-05-27 DIAGNOSIS — S21119A Laceration without foreign body of unspecified front wall of thorax without penetration into thoracic cavity, initial encounter: Secondary | ICD-10-CM

## 2015-05-27 DIAGNOSIS — K219 Gastro-esophageal reflux disease without esophagitis: Secondary | ICD-10-CM | POA: Diagnosis present

## 2015-05-27 DIAGNOSIS — Z79899 Other long term (current) drug therapy: Secondary | ICD-10-CM

## 2015-05-27 DIAGNOSIS — S41012A Laceration without foreign body of left shoulder, initial encounter: Secondary | ICD-10-CM | POA: Diagnosis present

## 2015-05-27 DIAGNOSIS — S299XXA Unspecified injury of thorax, initial encounter: Secondary | ICD-10-CM

## 2015-05-27 DIAGNOSIS — S31119A Laceration without foreign body of abdominal wall, unspecified quadrant without penetration into peritoneal cavity, initial encounter: Secondary | ICD-10-CM

## 2015-05-27 DIAGNOSIS — Z978 Presence of other specified devices: Secondary | ICD-10-CM

## 2015-05-27 DIAGNOSIS — F17210 Nicotine dependence, cigarettes, uncomplicated: Secondary | ICD-10-CM | POA: Diagnosis present

## 2015-05-27 DIAGNOSIS — T797XXA Traumatic subcutaneous emphysema, initial encounter: Secondary | ICD-10-CM | POA: Diagnosis present

## 2015-05-27 DIAGNOSIS — S270XXA Traumatic pneumothorax, initial encounter: Secondary | ICD-10-CM

## 2015-05-27 DIAGNOSIS — J9382 Other air leak: Secondary | ICD-10-CM | POA: Diagnosis present

## 2015-05-27 DIAGNOSIS — J939 Pneumothorax, unspecified: Secondary | ICD-10-CM | POA: Diagnosis present

## 2015-05-27 DIAGNOSIS — R1310 Dysphagia, unspecified: Secondary | ICD-10-CM

## 2015-05-27 DIAGNOSIS — J9600 Acute respiratory failure, unspecified whether with hypoxia or hypercapnia: Secondary | ICD-10-CM | POA: Diagnosis present

## 2015-05-27 DIAGNOSIS — J860 Pyothorax with fistula: Secondary | ICD-10-CM | POA: Diagnosis present

## 2015-05-27 DIAGNOSIS — J942 Hemothorax: Secondary | ICD-10-CM | POA: Diagnosis present

## 2015-05-27 DIAGNOSIS — J95821 Acute postprocedural respiratory failure: Secondary | ICD-10-CM | POA: Diagnosis present

## 2015-05-27 DIAGNOSIS — S21319A Laceration without foreign body of unspecified front wall of thorax with penetration into thoracic cavity, initial encounter: Secondary | ICD-10-CM

## 2015-05-27 DIAGNOSIS — W458XXA Other foreign body or object entering through skin, initial encounter: Secondary | ICD-10-CM | POA: Diagnosis present

## 2015-05-27 DIAGNOSIS — Z590 Homelessness: Secondary | ICD-10-CM

## 2015-05-27 DIAGNOSIS — J9811 Atelectasis: Secondary | ICD-10-CM | POA: Diagnosis present

## 2015-05-27 DIAGNOSIS — S21112A Laceration without foreign body of left front wall of thorax without penetration into thoracic cavity, initial encounter: Secondary | ICD-10-CM | POA: Diagnosis present

## 2015-05-27 DIAGNOSIS — D62 Acute posthemorrhagic anemia: Secondary | ICD-10-CM | POA: Diagnosis present

## 2015-05-27 DIAGNOSIS — T148XXA Other injury of unspecified body region, initial encounter: Secondary | ICD-10-CM

## 2015-05-27 DIAGNOSIS — S31109A Unspecified open wound of abdominal wall, unspecified quadrant without penetration into peritoneal cavity, initial encounter: Secondary | ICD-10-CM

## 2015-05-27 DIAGNOSIS — I471 Supraventricular tachycardia: Secondary | ICD-10-CM | POA: Diagnosis present

## 2015-05-27 DIAGNOSIS — S272XXA Traumatic hemopneumothorax, initial encounter: Secondary | ICD-10-CM

## 2015-05-27 HISTORY — PX: VIDEO BRONCHOSCOPY: SHX5072

## 2015-05-27 LAB — POCT I-STAT 3, ART BLOOD GAS (G3+)
Acid-base deficit: 4 mmol/L — ABNORMAL HIGH (ref 0.0–2.0)
Bicarbonate: 20.3 mEq/L (ref 20.0–24.0)
O2 SAT: 95 %
PCO2 ART: 33 mmHg — AB (ref 35.0–45.0)
PH ART: 7.396 (ref 7.350–7.450)
Patient temperature: 98.5
TCO2: 21 mmol/L (ref 0–100)
pO2, Arterial: 77 mmHg — ABNORMAL LOW (ref 80.0–100.0)

## 2015-05-27 LAB — COMPREHENSIVE METABOLIC PANEL
ALBUMIN: 4.4 g/dL (ref 3.5–5.0)
ALK PHOS: 54 U/L (ref 38–126)
ALT: 29 U/L (ref 17–63)
AST: 55 U/L — AB (ref 15–41)
Anion gap: 14 (ref 5–15)
BUN: 14 mg/dL (ref 6–20)
CHLORIDE: 106 mmol/L (ref 101–111)
CO2: 21 mmol/L — AB (ref 22–32)
Calcium: 8.4 mg/dL — ABNORMAL LOW (ref 8.9–10.3)
Creatinine, Ser: 1.1 mg/dL (ref 0.61–1.24)
GFR calc Af Amer: >60 mL/min (ref >60)
GFR calc non Af Amer: >60 mL/min (ref >60)
GLUCOSE: 101 mg/dL — AB (ref 65–99)
Potassium: 3.4 mmol/L — ABNORMAL LOW (ref 3.5–5.1)
SODIUM: 141 mmol/L (ref 135–145)
TOTAL PROTEIN: 7.2 g/dL (ref 6.5–8.1)
Total Bilirubin: 0.9 mg/dL (ref 0.3–1.2)

## 2015-05-27 LAB — CBC WITH DIFFERENTIAL/PLATELET
BASOS ABS: 0.1 10*3/uL (ref 0.0–0.1)
BASOS PCT: 1 % (ref 0–1)
EOS ABS: 0.2 10*3/uL (ref 0.0–0.7)
EOS PCT: 2 % (ref 0–5)
HEMATOCRIT: 42.6 % (ref 39.0–52.0)
Hemoglobin: 14.6 g/dL (ref 13.0–17.0)
Lymphocytes Relative: 41 % (ref 12–46)
Lymphs Abs: 3.6 10*3/uL (ref 0.7–4.0)
MCH: 32.7 pg (ref 26.0–34.0)
MCHC: 34.3 g/dL (ref 30.0–36.0)
MCV: 95.5 fL (ref 78.0–100.0)
Monocytes Absolute: 0.6 10*3/uL (ref 0.1–1.0)
Monocytes Relative: 7 % (ref 3–12)
NEUTROS ABS: 4.3 10*3/uL (ref 1.7–7.7)
Neutrophils Relative %: 49 % (ref 43–77)
Platelets: 191 10*3/uL (ref 150–400)
RBC: 4.46 MIL/uL (ref 4.22–5.81)
RDW: 13.1 % (ref 11.5–15.5)
WBC: 8.7 10*3/uL (ref 4.0–10.5)

## 2015-05-27 LAB — URINALYSIS, ROUTINE W REFLEX MICROSCOPIC
Bilirubin Urine: NEGATIVE
Glucose, UA: NEGATIVE mg/dL
KETONES UR: NEGATIVE mg/dL
LEUKOCYTES UA: NEGATIVE
Nitrite: NEGATIVE
Protein, ur: NEGATIVE mg/dL
Specific Gravity, Urine: 1.025 (ref 1.005–1.030)
Urobilinogen, UA: 0.2 mg/dL (ref 0.0–1.0)
pH: 5 (ref 5.0–8.0)

## 2015-05-27 LAB — I-STAT ARTERIAL BLOOD GAS, ED
ACID-BASE DEFICIT: 4 mmol/L — AB (ref 0.0–2.0)
BICARBONATE: 21.4 meq/L (ref 20.0–24.0)
O2 Saturation: 97 %
PH ART: 7.376 (ref 7.350–7.450)
Patient temperature: 96.6
TCO2: 23 mmol/L (ref 0–100)
pCO2 arterial: 36.2 mmHg (ref 35.0–45.0)
pO2, Arterial: 90 mmHg (ref 80.0–100.0)

## 2015-05-27 LAB — RAPID URINE DRUG SCREEN, HOSP PERFORMED
AMPHETAMINES: NOT DETECTED
Barbiturates: NOT DETECTED
Benzodiazepines: POSITIVE — AB
Cocaine: POSITIVE — AB
OPIATES: NOT DETECTED
Tetrahydrocannabinol: NOT DETECTED

## 2015-05-27 LAB — PROTIME-INR
INR: 1.13 (ref 0.00–1.49)
PROTHROMBIN TIME: 14.7 s (ref 11.6–15.2)

## 2015-05-27 LAB — TRIGLYCERIDES: Triglycerides: 135 mg/dL (ref <150)

## 2015-05-27 LAB — URINE MICROSCOPIC-ADD ON

## 2015-05-27 LAB — APTT: APTT: 24 s (ref 24–37)

## 2015-05-27 LAB — SAMPLE TO BLOOD BANK

## 2015-05-27 LAB — ETHANOL: ALCOHOL ETHYL (B): 229 mg/dL — AB (ref <5)

## 2015-05-27 SURGERY — BRONCHOSCOPY, VIDEO-ASSISTED
Anesthesia: Moderate Sedation | Site: Bronchus

## 2015-05-27 MED ORDER — ETOMIDATE 2 MG/ML IV SOLN
INTRAVENOUS | Status: AC | PRN
  Administered 2015-05-27: 20 mg via INTRAVENOUS

## 2015-05-27 MED ORDER — PROPOFOL 1000 MG/100ML IV EMUL
5.0000 ug/kg/min | Freq: Once | INTRAVENOUS | Status: DC
  Administered 2015-05-27: 5 ug/kg/min via INTRAVENOUS

## 2015-05-27 MED ORDER — BISACODYL 10 MG RE SUPP: 10.0000 mg | Freq: Every day | RECTAL | Status: DC | PRN

## 2015-05-27 MED ORDER — SODIUM CHLORIDE 0.9 % IV SOLN
INTRAVENOUS | Status: DC
  Administered 2015-05-27 – 2015-05-28 (×4): via INTRAVENOUS

## 2015-05-27 MED ORDER — SODIUM CHLORIDE 0.9 % IV SOLN
1.0000 mg/h | INTRAVENOUS | Status: DC
  Administered 2015-05-27 (×2): 1 mg/h via INTRAVENOUS
  Filled 2015-05-27: qty 10

## 2015-05-27 MED ORDER — ACETAMINOPHEN 325 MG PO TABS
650.0000 mg | ORAL_TABLET | ORAL | Status: DC | PRN
Start: 2015-05-27 — End: 2015-05-30
  Administered 2015-05-29: 650 mg via ORAL
  Filled 2015-05-27: qty 2

## 2015-05-27 MED ORDER — SUCCINYLCHOLINE CHLORIDE 20 MG/ML IJ SOLN
INTRAMUSCULAR | Status: AC | PRN
Start: 2015-05-27 — End: 2015-05-27
  Administered 2015-05-27: 125 mg via INTRAVENOUS

## 2015-05-27 MED ORDER — CHLORHEXIDINE GLUCONATE 0.12 % MT SOLN
15.0000 mL | Freq: Two times a day (BID) | OROMUCOSAL | Status: DC
  Administered 2015-05-27 – 2015-06-02 (×11): 15 mL via OROMUCOSAL
  Filled 2015-05-27 (×13): qty 15

## 2015-05-27 MED ORDER — FENTANYL CITRATE (PF) 100 MCG/2ML IJ SOLN
50.0000 ug | Freq: Once | INTRAMUSCULAR | Status: AC
  Administered 2015-05-27: 50 ug via INTRAVENOUS
  Filled 2015-05-27: qty 2

## 2015-05-27 MED ORDER — ENOXAPARIN SODIUM 40 MG/0.4ML ~~LOC~~ SOLN
40.0000 mg | SUBCUTANEOUS | Status: DC
  Administered 2015-05-28 – 2015-05-31 (×4): 40 mg via SUBCUTANEOUS
  Filled 2015-05-27 (×7): qty 0.4

## 2015-05-27 MED ORDER — PROPOFOL 1000 MG/100ML IV EMUL
0.0000 ug/kg/min | INTRAVENOUS | Status: DC
  Administered 2015-05-27: 50 ug/kg/min via INTRAVENOUS

## 2015-05-27 MED ORDER — ONDANSETRON HCL 4 MG/2ML IJ SOLN
4.0000 mg | Freq: Four times a day (QID) | INTRAMUSCULAR | Status: DC | PRN
  Administered 2015-06-01 (×2): 4 mg via INTRAVENOUS
  Filled 2015-05-27 (×2): qty 2

## 2015-05-27 MED ORDER — PANTOPRAZOLE SODIUM 40 MG PO TBEC
40.0000 mg | DELAYED_RELEASE_TABLET | Freq: Every day | ORAL | Status: DC
  Administered 2015-05-31 – 2015-06-02 (×2): 40 mg via ORAL
  Filled 2015-05-27 (×3): qty 1

## 2015-05-27 MED ORDER — PANTOPRAZOLE SODIUM 40 MG IV SOLR
40.0000 mg | Freq: Every day | INTRAVENOUS | Status: DC
  Administered 2015-05-27 – 2015-05-30 (×4): 40 mg via INTRAVENOUS
  Filled 2015-05-27 (×5): qty 40

## 2015-05-27 MED ORDER — FENTANYL CITRATE (PF) 100 MCG/2ML IJ SOLN
100.0000 ug | INTRAMUSCULAR | Status: DC | PRN
  Administered 2015-05-30: 100 ug via INTRAVENOUS
  Filled 2015-05-27: qty 2

## 2015-05-27 MED ORDER — FENTANYL CITRATE (PF) 100 MCG/2ML IJ SOLN
100.0000 ug | INTRAMUSCULAR | Status: DC | PRN
  Administered 2015-05-27: 100 ug via INTRAVENOUS
  Filled 2015-05-27: qty 2

## 2015-05-27 MED ORDER — CETYLPYRIDINIUM CHLORIDE 0.05 % MT LIQD
7.0000 mL | Freq: Four times a day (QID) | OROMUCOSAL | Status: DC
  Administered 2015-05-27 – 2015-06-02 (×15): 7 mL via OROMUCOSAL

## 2015-05-27 MED ORDER — FENTANYL BOLUS VIA INFUSION
50.0000 ug | INTRAVENOUS | Status: DC | PRN
Start: 2015-05-27 — End: 2015-05-30
  Administered 2015-05-27 – 2015-05-30 (×7): 50 ug via INTRAVENOUS
  Filled 2015-05-27: qty 50

## 2015-05-27 MED ORDER — LIDOCAINE HCL (PF) 1 % IJ SOLN
INTRAMUSCULAR | Status: AC
  Administered 2015-05-27: 14:00:00
  Filled 2015-05-27: qty 5

## 2015-05-27 MED ORDER — 0.9 % SODIUM CHLORIDE (POUR BTL) OPTIME
TOPICAL | Status: DC | PRN
  Administered 2015-05-27: 500 mL

## 2015-05-27 MED ORDER — MIDAZOLAM HCL 5 MG/5ML IJ SOLN
5.0000 mg | Freq: Once | INTRAMUSCULAR | Status: AC
  Administered 2015-05-27: 5 mg via INTRAVENOUS

## 2015-05-27 MED ORDER — MIDAZOLAM HCL 50 MG/10ML IJ SOLN
INTRAMUSCULAR | Status: AC
Start: 2015-05-27 — End: 2015-05-27
  Filled 2015-05-27: qty 1

## 2015-05-27 MED ORDER — PROPOFOL 1000 MG/100ML IV EMUL
INTRAVENOUS | Status: AC
  Filled 2015-05-27: qty 100

## 2015-05-27 MED ORDER — FENTANYL CITRATE (PF) 100 MCG/2ML IJ SOLN
INTRAMUSCULAR | Status: AC
  Filled 2015-05-27: qty 2

## 2015-05-27 MED ORDER — PROPOFOL 1000 MG/100ML IV EMUL
5.0000 ug/kg/min | INTRAVENOUS | Status: DC
  Administered 2015-05-27: 50 ug/kg/min via INTRAVENOUS
  Administered 2015-05-27: 40 ug/kg/min via INTRAVENOUS
  Administered 2015-05-27: 50 ug/kg/min via INTRAVENOUS
  Administered 2015-05-28 (×3): 20 ug/kg/min via INTRAVENOUS
  Administered 2015-05-29: 15 ug/kg/min via INTRAVENOUS
  Administered 2015-05-29: 9.981 ug/kg/min via INTRAVENOUS
  Filled 2015-05-27 (×9): qty 100

## 2015-05-27 MED ORDER — IOHEXOL 350 MG/ML SOLN
100.0000 mL | Freq: Once | INTRAVENOUS | Status: AC | PRN
  Administered 2015-05-27: 100 mL via INTRAVENOUS

## 2015-05-27 MED ORDER — ONDANSETRON HCL 4 MG PO TABS: 4.0000 mg | ORAL_TABLET | Freq: Four times a day (QID) | ORAL | Status: DC | PRN

## 2015-05-27 MED ORDER — ONDANSETRON HCL 4 MG/2ML IJ SOLN
8.0000 mg | Freq: Once | INTRAMUSCULAR | Status: AC
  Administered 2015-05-27: 8 mg via INTRAVENOUS

## 2015-05-27 MED ORDER — SODIUM CHLORIDE 0.9 % IV SOLN
25.0000 ug/h | INTRAVENOUS | Status: DC
  Administered 2015-05-27: 25 ug/h via INTRAVENOUS
  Administered 2015-05-28 – 2015-05-29 (×2): 100 ug/h via INTRAVENOUS
  Filled 2015-05-27 (×3): qty 50

## 2015-05-27 SURGICAL SUPPLY — 27 items
BALL CTTN LRG ABS STRL LF (GAUZE/BANDAGES/DRESSINGS)
BLOCK BITE 60FR ADLT L/F BLUE (MISCELLANEOUS) ×1 IMPLANT
BRUSH CYTOL CELLEBRITY 1.5X140 (MISCELLANEOUS) IMPLANT
CANISTER SUCTION 2500CC (MISCELLANEOUS) ×3 IMPLANT
CONT SPEC 4OZ CLIKSEAL STRL BL (MISCELLANEOUS) ×5 IMPLANT
COTTONBALL LRG STERILE PKG (GAUZE/BANDAGES/DRESSINGS) IMPLANT
COVER TABLE BACK 60X90 (DRAPES) ×1 IMPLANT
FILTER STRAW FLUID ASPIR (MISCELLANEOUS) IMPLANT
FORCEPS BIOP RJ4 1.8 (CUTTING FORCEPS) IMPLANT
GAUZE SPONGE 4X4 12PLY STRL (GAUZE/BANDAGES/DRESSINGS) ×3 IMPLANT
GLOVE SURG SIGNA 7.5 PF LTX (GLOVE) ×3 IMPLANT
KIT CLEAN ENDO COMPLIANCE (KITS) ×5 IMPLANT
KIT ROOM TURNOVER OR (KITS) ×1 IMPLANT
NDL BIOPSY TRANSBRONCH 21G (NEEDLE) IMPLANT
NEEDLE 22X1 1/2 (OR ONLY) (NEEDLE) IMPLANT
NEEDLE BIOPSY TRANSBRONCH 21G (NEEDLE) IMPLANT
NS IRRIG 1000ML POUR BTL (IV SOLUTION) ×3 IMPLANT
PAD ARMBOARD 7.5X6 YLW CONV (MISCELLANEOUS) ×2 IMPLANT
SOLUTION ANTI FOG 6CC (MISCELLANEOUS) ×1 IMPLANT
SYR 20ML ECCENTRIC (SYRINGE) ×3 IMPLANT
SYR 5ML LL (SYRINGE) ×1 IMPLANT
SYR 5ML LUER SLIP (SYRINGE) ×1 IMPLANT
SYR CONTROL 10ML LL (SYRINGE) IMPLANT
TOWEL OR 17X24 6PK STRL BLUE (TOWEL DISPOSABLE) ×3 IMPLANT
TRAP SPECIMEN MUCOUS 40CC (MISCELLANEOUS) ×2 IMPLANT
TUBE CONNECTING 20'X1/4 (TUBING) ×1
TUBE CONNECTING 20X1/4 (TUBING) ×2 IMPLANT

## 2015-05-27 NOTE — ED Notes (Signed)
Patient arrived via [place] from [facility] ED.  Dr Donne Hazel at bedside.
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
## 2015-05-27 NOTE — Procedures (Signed)
Patient intubated and sedated.  Flexible fiberoptic bronchoscopy performed via the ETT  Large amounts of clotted blood bilaterally. Saline lavage performed to clear blood from airways.  No central bronchial injury noted  Patient tolerated well, remained stable throughout  [place]. Roxan Hockey, MD [facility] [phone]
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
## 2015-05-27 NOTE — Progress Notes (Signed)
      [name]       [place] [address]             [phone]      Repeat CXR showed a large left pneumothorax  Clinically is stable  BP 94/50 mmHg  Pulse 65  Temp(Src) 97.1 F (36.2 C) (Core (Comment))  Resp 15  Ht 5' 8.9" (1.75 m)  Wt 158 lb 4.6 oz (71.8 kg)  BMI 23.44 kg/m2  SpO2 99%  CT placed back to suction  His air leak is bigger on suction but did not have a major impact on his expired tidal volume. Currently inspired TV 550 and expired TV 350-380. Minute ventilation 11.5 in, 7.5 out  Will leave on suction for now  [place]. Roxan Hockey, MD [facility] [phone]
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
## 2015-05-27 NOTE — Progress Notes (Signed)
Patient has massive subcutaneous air with a high rate continuous air leak.  I suspect an airway injury and I have asked Thoracic surgery to see this patient.  I check the CT insertion site, and even though the last hole of the chest tube is near the margin of the chest wall, the leak appears to be coming from inside and I cannot detect any leakage at the skin level.  Not sure if any other studies will be necessary, but will leave that up to CVTS.  Kathryne Eriksson. Dahlia Bailiff, MD, [place] [phone] Trauma Surgeon
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
## 2015-05-27 NOTE — OR Nursing (Signed)
OR Record late entry, procedure done in SICU room [place].
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
## 2015-05-28 ENCOUNTER — Inpatient Hospital Stay (HOSPITAL_COMMUNITY): Payer: Self-pay

## 2015-05-28 ENCOUNTER — Encounter (HOSPITAL_COMMUNITY): Payer: Self-pay | Admitting: Thoracic Surgery (Cardiothoracic Vascular Surgery)

## 2015-05-28 LAB — CBC
HEMATOCRIT: 36.6 % — AB (ref 39.0–52.0)
Hemoglobin: 12.2 g/dL — ABNORMAL LOW (ref 13.0–17.0)
MCH: 32.2 pg (ref 26.0–34.0)
MCHC: 33.3 g/dL (ref 30.0–36.0)
MCV: 96.6 fL (ref 78.0–100.0)
Platelets: 145 10*3/uL — ABNORMAL LOW (ref 150–400)
RBC: 3.79 MIL/uL — AB (ref 4.22–5.81)
RDW: 13.9 % (ref 11.5–15.5)
WBC: 11.5 10*3/uL — ABNORMAL HIGH (ref 4.0–10.5)

## 2015-05-28 LAB — BASIC METABOLIC PANEL
ANION GAP: 14 (ref 5–15)
BUN: 13 mg/dL (ref 6–20)
CALCIUM: 8 mg/dL — AB (ref 8.9–10.3)
CHLORIDE: 108 mmol/L (ref 101–111)
CO2: 19 mmol/L — ABNORMAL LOW (ref 22–32)
Creatinine, Ser: 1.03 mg/dL (ref 0.61–1.24)
GFR calc Af Amer: >60 mL/min (ref >60)
GFR calc non Af Amer: >60 mL/min (ref >60)
GLUCOSE: 49 mg/dL — AB (ref 65–99)
POTASSIUM: 3.5 mmol/L (ref 3.5–5.1)
Sodium: 141 mmol/L (ref 135–145)

## 2015-05-28 LAB — GLUCOSE, CAPILLARY
Glucose-Capillary: 45 mg/dL — ABNORMAL LOW (ref 65–99)
Glucose-Capillary: 112 mg/dL — ABNORMAL HIGH (ref 65–99)
Glucose-Capillary: 39 mg/dL — CL (ref 65–99)
Glucose-Capillary: 109 mg/dL — ABNORMAL HIGH (ref 65–99)
Glucose-Capillary: 104 mg/dL — ABNORMAL HIGH (ref 65–99)

## 2015-05-28 LAB — MRSA PCR SCREENING: MRSA by PCR: NEGATIVE

## 2015-05-28 MED ORDER — DEXTROSE 50 % IV SOLN
25.0000 mL | Freq: Once | INTRAVENOUS | Status: AC
  Administered 2015-05-28: 25 mL via INTRAVENOUS
  Filled 2015-05-28: qty 50

## 2015-05-28 MED ORDER — LORAZEPAM 2 MG/ML IJ SOLN
1.0000 mg | Freq: Four times a day (QID) | INTRAMUSCULAR | Status: AC | PRN
  Administered 2015-05-30: 1 mg via INTRAVENOUS
  Filled 2015-05-28: qty 1

## 2015-05-28 MED ORDER — LORAZEPAM 1 MG PO TABS: 1.0000 mg | ORAL_TABLET | Freq: Four times a day (QID) | ORAL | Status: AC | PRN

## 2015-05-28 MED ORDER — KCL IN DEXTROSE-NACL 20-5-0.45 MEQ/L-%-% IV SOLN
INTRAVENOUS | Status: DC
  Administered 2015-05-28: 19:00:00 via INTRAVENOUS
  Administered 2015-05-28: 125 mL/h via INTRAVENOUS
  Administered 2015-05-29 – 2015-05-31 (×5): via INTRAVENOUS
  Filled 2015-05-28 (×14): qty 1000

## 2015-05-28 MED ORDER — FOLIC ACID 1 MG PO TABS
1.0000 mg | ORAL_TABLET | Freq: Every day | ORAL | Status: DC
  Administered 2015-05-28 – 2015-06-02 (×5): 1 mg via ORAL
  Filled 2015-05-28 (×6): qty 1

## 2015-05-28 MED ORDER — DEXTROSE 50 % IV SOLN
INTRAVENOUS | Status: AC
  Administered 2015-05-28: 50 mL
  Filled 2015-05-28: qty 50

## 2015-05-28 MED ORDER — THIAMINE HCL 100 MG/ML IJ SOLN
100.0000 mg | Freq: Every day | INTRAMUSCULAR | Status: DC
  Filled 2015-05-28 (×4): qty 1

## 2015-05-28 MED ORDER — VITAMIN B-1 100 MG PO TABS
100.0000 mg | ORAL_TABLET | Freq: Every day | ORAL | Status: DC
  Administered 2015-05-28 – 2015-06-02 (×5): 100 mg via ORAL
  Filled 2015-05-28 (×6): qty 1

## 2015-05-28 MED ORDER — ADULT MULTIVITAMIN W/MINERALS CH
1.0000 | ORAL_TABLET | Freq: Every day | ORAL | Status: DC
  Administered 2015-05-28 – 2015-06-02 (×5): 1 via ORAL
  Filled 2015-05-28 (×6): qty 1

## 2015-05-28 MED FILL — Medication: Qty: 1 | Status: AC

## 2015-05-28 NOTE — Plan of Care (Signed)
Problem: Problem: Respiratory Progression Goal: ABLE TO EXTUBATE Outcome: Progressing Weaned most of day today Goal: ABLE TO D/C CHEST TUBE Outcome: Not Progressing Still has airleak at this time

## 2015-05-28 NOTE — Progress Notes (Signed)
Patient ID: Troy Robbins, male   DOB: December 01, 1970, 44 y.o.   MRN: [id number] TCTS DAILY ICU PROGRESS NOTE                   [address]            [place] [address]          [phone]   1 Day Post-Op Procedure(s) (LRB): BEDSIDE VIDEO BRONCHOSCOPY (N/A)  Total Length of Stay:  LOS: 1 day   Subjective: Sedated on vent, bronch done yesterday   Objective: Vital signs in last 24 hours: Temp:  [97.1 F (36.2 C)-99.1 F (37.3 C)] 99 F (37.2 C) (07/22 0600) Pulse Rate:  [55-79] 55 (07/22 0600) Cardiac Rhythm:  [-] Normal sinus rhythm;Sinus bradycardia (07/22 0400) Resp:  [15-30] 23 (07/22 0600) BP: (85-112)/(47-70) 112/67 mmHg (07/22 0600) SpO2:  [94 %-100 %] 100 % (07/22 0600) FiO2 (%):  [40 %] 40 % (07/22 0400)  Filed Weights   05/27/15 0500  Weight: 158 lb 4.6 oz (71.8 kg)    Weight change:    Hemodynamic parameters for last 24 hours:    Intake/Output from previous day: 07/21 0701 - 07/22 0700 In: 3125.9 [I.V.:2975.9; NG/GT:150] Out: 1370 [Urine:790; Emesis/NG output:400; Chest Tube:180]  Intake/Output this shift:    Current Meds: Scheduled Meds: . antiseptic oral rinse  7 mL Mouth Rinse QID  . chlorhexidine  15 mL Mouth Rinse BID  . enoxaparin (LOVENOX) injection  40 mg Subcutaneous Q24H  . pantoprazole  40 mg Oral Daily   Or  . pantoprazole (PROTONIX) IV  40 mg Intravenous Daily   Continuous Infusions: . sodium chloride 100 mL/hr at 05/28/15 0323  . fentaNYL infusion INTRAVENOUS 100 mcg/hr (05/27/15 2100)  . propofol (DIPRIVAN) infusion 20 mcg/kg/min (05/28/15 0138)   PRN Meds:.acetaminophen, bisacodyl, fentaNYL, fentaNYL (SUBLIMAZE) injection, fentaNYL (SUBLIMAZE) injection, ondansetron **OR** ondansetron (ZOFRAN) IV  General appearance: sedated Neurologic: intact Heart: regular rate and rhythm, S1, S2 normal, no murmur, click, rub or gallop Lungs: diminished breath sounds bilaterally and rhonchi bilaterally Abdomen: soft,  non-tender; bowel sounds normal; no masses,  no organomegaly Extremities: extremities normal, atraumatic, no cyanosis or edema and Homans sign is negative, no sign of DVT Wound: dressing intact  Lab Results: CBC: Recent Labs  05/27/15 0230 05/28/15 0230  WBC 8.7 11.5*  HGB 14.6 12.2*  HCT 42.6 36.6*  PLT 191 145*   BMET:  Recent Labs  05/27/15 0230 05/28/15 0230  NA 141 141  K 3.4* 3.5  CL 106 108  CO2 21* 19*  GLUCOSE 101* 49*  BUN 14 13  CREATININE 1.10 1.03  CALCIUM 8.4* 8.0*    PT/INR:  Recent Labs  05/27/15 0230  LABPROT 14.7  INR 1.13   Radiology: Dg Chest Port 1 View  05/27/2015   CLINICAL DATA:  Followup of pneumothorax. Air leak at chest tube insertion site.  EXAM: PORTABLE CHEST - 1 VIEW  COMPARISON:  Earlier today at 0356 hours.  FINDINGS: 1600 hours. Endotracheal tube appropriately positioned, terminating 5.2 cm above carina. Nasogastric extends beyond the inferior aspect of the film. Left-sided chest tube is unchanged in position. Again demonstrated is left greater than right subcutaneous emphysema. Superior pneumomediastinum. Midline trachea. Normal heart size. Increase moderate left-sided pneumothorax. Visceral pleural line maximally 3.2 cm from chest wall and apex.  Clear right lung. Right costophrenic angle minimally excluded. persistent left infrahilar and lower lobe atelectasis.  IMPRESSION: Left chest tube remaining in place. Increase in approximately  30% left pneumothorax. These results will be called to the ordering clinician or representative by the Radiologist Assistant, and communication documented in the PACS or zVision Dashboard.  Persistent subcutaneous emphysema, worse on the left.   Electronically Signed   By: Abigail Miyamoto M.D.   On: 05/27/2015 16:30     Assessment/Plan: S/P Procedure(s) (LRB): BEDSIDE VIDEO BRONCHOSCOPY (N/A) persistent air leak from chest tubes, decreasing subq air Try to wean vent and extubated, if air leak does not heal  without intervention, consider thoracotomy or bronchial valves via bronchoscopy    Grace Isaac 05/28/2015 7:17 AM

## 2015-05-28 NOTE — Progress Notes (Signed)
Hypoglycemic Event  CBG: 45  Treatment: D50 IV 25 mL  Symptoms: None  Follow-up CBG: Time:0337 CBG Result:112  Possible Reasons for Event: Unknown    Troy Robbins S Troy Robbins  Remember to initiate Hypoglycemia Order Set & complete
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
## 2015-05-29 ENCOUNTER — Inpatient Hospital Stay (HOSPITAL_COMMUNITY): Payer: Self-pay

## 2015-05-29 DIAGNOSIS — I471 Supraventricular tachycardia: Secondary | ICD-10-CM

## 2015-05-29 LAB — BLOOD GAS, ARTERIAL
ACID-BASE EXCESS: 2.5 mmol/L — AB (ref 0.0–2.0)
Bicarbonate: 27 mEq/L — ABNORMAL HIGH (ref 20.0–24.0)
Drawn by: 44138
FIO2: 0.4 %
MECHVT: 550 mL
O2 Saturation: 91.7 %
PEEP: 5 cmH2O
Patient temperature: 101.3
RATE: 12 resp/min
TCO2: 28.3 mmol/L (ref 0–100)
pCO2 arterial: 48.2 mmHg — ABNORMAL HIGH (ref 35.0–45.0)
pH, Arterial: 7.375 (ref 7.350–7.450)
pO2, Arterial: 67.4 mmHg — ABNORMAL LOW (ref 80.0–100.0)

## 2015-05-29 LAB — CBC
HEMATOCRIT: 37.3 % — AB (ref 39.0–52.0)
Hemoglobin: 12.5 g/dL — ABNORMAL LOW (ref 13.0–17.0)
MCH: 32.9 pg (ref 26.0–34.0)
MCHC: 33.5 g/dL (ref 30.0–36.0)
MCV: 98.2 fL (ref 78.0–100.0)
Platelets: 125 10*3/uL — ABNORMAL LOW (ref 150–400)
RBC: 3.8 MIL/uL — AB (ref 4.22–5.81)
RDW: 14.1 % (ref 11.5–15.5)
WBC: 11.4 10*3/uL — AB (ref 4.0–10.5)

## 2015-05-29 LAB — GLUCOSE, CAPILLARY: Glucose-Capillary: 103 mg/dL — ABNORMAL HIGH (ref 65–99)

## 2015-05-29 LAB — TROPONIN I: Troponin I: <0.03 ng/mL (ref <0.031)

## 2015-05-29 LAB — BASIC METABOLIC PANEL
Anion gap: 6 (ref 5–15)
BUN: 6 mg/dL (ref 6–20)
CO2: 26 mmol/L (ref 22–32)
Calcium: 8 mg/dL — ABNORMAL LOW (ref 8.9–10.3)
Chloride: 105 mmol/L (ref 101–111)
Creatinine, Ser: 1.04 mg/dL (ref 0.61–1.24)
GFR calc Af Amer: >60 mL/min (ref >60)
GFR calc non Af Amer: >60 mL/min (ref >60)
GLUCOSE: 108 mg/dL — AB (ref 65–99)
POTASSIUM: 3.7 mmol/L (ref 3.5–5.1)
SODIUM: 137 mmol/L (ref 135–145)

## 2015-05-29 LAB — MAGNESIUM: Magnesium: 1.8 mg/dL (ref 1.7–2.4)

## 2015-05-29 MED ORDER — ADENOSINE 6 MG/2ML IV SOLN
INTRAVENOUS | Status: AC
  Administered 2015-05-29: 6 mg
  Filled 2015-05-29: qty 2

## 2015-05-29 MED ORDER — ADENOSINE 6 MG/2ML IV SOLN: 6.0000 mg | Freq: Once | INTRAVENOUS | Status: AC

## 2015-05-29 MED ORDER — METOPROLOL TARTRATE 1 MG/ML IV SOLN
5.0000 mg | Freq: Once | INTRAVENOUS | Status: AC
  Administered 2015-05-29: 5 mg via INTRAVENOUS

## 2015-05-29 MED ORDER — DILTIAZEM HCL 100 MG IV SOLR
5.0000 mg/h | INTRAVENOUS | Status: DC
  Administered 2015-05-29: 5 mg/h via INTRAVENOUS
  Filled 2015-05-29: qty 100

## 2015-05-29 MED ORDER — METOPROLOL TARTRATE 1 MG/ML IV SOLN
INTRAVENOUS | Status: AC
  Filled 2015-05-29: qty 5

## 2015-05-29 MED ORDER — SODIUM CHLORIDE 0.9 % IV BOLUS (SEPSIS)
1000.0000 mL | Freq: Once | INTRAVENOUS | Status: AC
  Administered 2015-05-29: 1000 mL via INTRAVENOUS

## 2015-05-29 NOTE — Progress Notes (Signed)
Called to room by RN due to HR increase 186, Pt seems comfortable & in no distress on wean.  Pt placed back on full support at this time due to the inc HR.  RN calling MD and making aware.
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
## 2015-05-29 NOTE — Plan of Care (Signed)
Problem: Problem: Respiratory Progression Goal: ABLE TO EXTUBATE Outcome: Not Progressing Pt went into SVT today while weaning, still large amount of secretions

## 2015-05-29 NOTE — Progress Notes (Signed)
2 Days Post-Op  Subjective: On vent, awake and alert HR suddenly increased to 180's  Objective: Vital signs in last 24 hours: Temp:  [99.4 F (37.4 C)-101.4 F (38.6 C)] 100.9 F (38.3 C) (07/23 0700) Pulse Rate:  [52-79] 78 (07/23 0818) Resp:  [12-25] 25 (07/23 0818) BP: (92-123)/(51-77) 102/55 mmHg (07/23 0818) SpO2:  [91 %-100 %] 95 % (07/23 0818) FiO2 (%):  [40 %] 40 % (07/23 1029)    Intake/Output from previous day: 07/22 0701 - 07/23 0700 In: 3301.7 [I.V.:3271.7; NG/GT:30] Out: 1195 [Urine:975; Emesis/NG output:50; Chest Tube:170] Intake/Output this shift: Total I/O In: 30 [NG/GT:30] Out: 110 [Urine:60; Chest Tube:50]  Lungs clear CV tachy  Lab Results:   Recent Labs  05/28/15 0230 05/29/15 0217  WBC 11.5* 11.4*  HGB 12.2* 12.5*  HCT 36.6* 37.3*  PLT 145* 125*   BMET  Recent Labs  05/28/15 0230 05/29/15 0217  NA 141 137  K 3.5 3.7  CL 108 105  CO2 19* 26  GLUCOSE 49* 108*  BUN 13 6  CREATININE 1.03 1.04  CALCIUM 8.0* 8.0*   PT/INR  Recent Labs  05/27/15 0230  LABPROT 14.7  INR 1.13   ABG  Recent Labs  05/27/15 1207 05/29/15 0500  PHART 7.396 7.375  HCO3 20.3 27.0*    Studies/Results: Dg Chest Port 1 View  05/29/2015   CLINICAL DATA:  Air leak  EXAM: PORTABLE CHEST - 1 VIEW  COMPARISON:  05/28/2015  FINDINGS: Cardiac shadow is stable. Nasogastric catheter is noted within the stomach. An endotracheal tube is noted in satisfactory position. Left chest tube is again seen with the proximal side port at the level of the rib cage. The previously seen pneumothorax is not well appreciated on this exam. Considerable subcutaneous emphysema remains. Persistent airspace disease is noted in the bases bilaterally. This has increased slightly in the right lung base. No new focal abnormality is seen.  IMPRESSION: No pneumothorax noted.  Bibasilar infiltrates slightly increased on the right.  Tubes and lines as described.   Electronically Signed   By:  Inez Catalina M.D.   On: 05/29/2015 07:45   Dg Chest Port 1 View  05/28/2015   CLINICAL DATA:  Trauma.  Pneumothorax.  EXAM: PORTABLE CHEST - 1 VIEW  COMPARISON:  Yesterday  FINDINGS: Endotracheal and NG tubes are stable. Stable left chest tube. The side hole is at the edge of the thorax. Left pneumothorax has nearly resolved. Tiny left apical pneumothorax persists. Emphysema about the chest in the soft tissues is stable. Airspace disease in the left mid and lower lung zones has increased. Minimal atelectasis at the right base is stable. There is no right pneumothorax. No obvious left rib fracture.  IMPRESSION: Left pneumothorax has nearly resolved.  Airspace disease in the left mid and lower lung zones has increased.   Electronically Signed   By: Marybelle Killings M.D.   On: 05/28/2015 07:37   Dg Chest Port 1 View  05/27/2015   CLINICAL DATA:  Followup of pneumothorax. Air leak at chest tube insertion site.  EXAM: PORTABLE CHEST - 1 VIEW  COMPARISON:  Earlier today at 0356 hours.  FINDINGS: 1600 hours. Endotracheal tube appropriately positioned, terminating 5.2 cm above carina. Nasogastric extends beyond the inferior aspect of the film. Left-sided chest tube is unchanged in position. Again demonstrated is left greater than right subcutaneous emphysema. Superior pneumomediastinum. Midline trachea. Normal heart size. Increase moderate left-sided pneumothorax. Visceral pleural line maximally 3.2 cm from chest wall and apex.  Clear right lung. Right costophrenic angle minimally excluded. persistent left infrahilar and lower lobe atelectasis.  IMPRESSION: Left chest tube remaining in place. Increase in approximately 30% left pneumothorax. These results will be called to the ordering clinician or representative by the Radiologist Assistant, and communication documented in the PACS or zVision Dashboard.  Persistent subcutaneous emphysema, worse on the left.   Electronically Signed   By: Abigail Miyamoto M.D.   On: 05/27/2015  16:30    Anti-infectives: Anti-infectives    None      Assessment/Plan: s/p Procedure(s): BEDSIDE VIDEO BRONCHOSCOPY (N/A)  Sudden SVT  Will check EKG, cardiac enzymes, bolus IVF CXR ok as well as hgb  LOS: 2 days    Troy Robbins A 05/29/2015

## 2015-05-29 NOTE — Progress Notes (Signed)
On arrival to pt's room, spo2 decreased low 80's, increased to 60% then to 80%, spo2 continued to decrease, pt now on 100% fio2, spo2 95%, RN paging MD to make aware

## 2015-05-30 ENCOUNTER — Inpatient Hospital Stay (HOSPITAL_COMMUNITY): Payer: Self-pay

## 2015-05-30 DIAGNOSIS — T148 Other injury of unspecified body region: Secondary | ICD-10-CM

## 2015-05-30 LAB — TRIGLYCERIDES: Triglycerides: 48 mg/dL (ref <150)

## 2015-05-30 MED ORDER — ACETAMINOPHEN 325 MG PO TABS
650.0000 mg | ORAL_TABLET | ORAL | Status: DC | PRN
  Administered 2015-05-30: 650 mg via ORAL
  Filled 2015-05-30: qty 2

## 2015-05-30 MED ORDER — HYDROMORPHONE HCL 1 MG/ML IJ SOLN
1.0000 mg | INTRAMUSCULAR | Status: DC | PRN
  Administered 2015-05-30 – 2015-05-31 (×5): 1 mg via INTRAVENOUS
  Filled 2015-05-30 (×5): qty 1

## 2015-05-30 NOTE — Progress Notes (Signed)
3 Days Post-Op  Subjective: Patient in sinus rhythm HR 80 Awake alert Cooperating with examination - wants his ETT out On SBT for only 20 minutes so far No CXR yet this morning  Objective: Vital signs in last 24 hours: Temp:  [100.1 F (37.8 C)-101.4 F (38.6 C)] 100.5 F (38.1 C) (07/24 0804) Pulse Rate:  [61-194] 84 (07/24 0804) Resp:  [13-29] 24 (07/24 0804) BP: (70-121)/(44-97) 96/69 mmHg (07/24 0804) SpO2:  [86 %-100 %] 97 % (07/24 0804) FiO2 (%):  [40 %-100 %] 40 % (07/24 0804) Weight:  [67.3 kg (148 lb 5.9 oz)] 67.3 kg (148 lb 5.9 oz) (07/24 0500)    Intake/Output from previous day: 07/23 0701 - 07/24 0700 In: 4432.8 [I.V.:3242.8; NG/GT:190; IV Piggyback:1000] Out: 2400 [Urine:2000; Emesis/NG output:100; Chest Tube:300] Intake/Output this shift: Total I/O In: 30 [NG/GT:30] Out: 210 [Urine:210]  General appearance: alert, cooperative and no distress Resp: clear to auscultation bilaterally Chest wall: no tenderness, left side chest tube in place  Lab Results:   Recent Labs  05/28/15 0230 05/29/15 0217  WBC 11.5* 11.4*  HGB 12.2* 12.5*  HCT 36.6* 37.3*  PLT 145* 125*   BMET  Recent Labs  05/28/15 0230 05/29/15 0217  NA 141 137  K 3.5 3.7  CL 108 105  CO2 19* 26  GLUCOSE 49* 108*  BUN 13 6  CREATININE 1.03 1.04  CALCIUM 8.0* 8.0*   PT/INR No results for input(s): LABPROT, INR in the last 72 hours. ABG  Recent Labs  05/27/15 1207 05/29/15 0500  PHART 7.396 7.375  HCO3 20.3 27.0*    Studies/Results: Dg Chest Port 1 View  05/29/2015   CLINICAL DATA:  Air leak  EXAM: PORTABLE CHEST - 1 VIEW  COMPARISON:  05/28/2015  FINDINGS: Cardiac shadow is stable. Nasogastric catheter is noted within the stomach. An endotracheal tube is noted in satisfactory position. Left chest tube is again seen with the proximal side port at the level of the rib cage. The previously seen pneumothorax is not well appreciated on this exam. Considerable subcutaneous  emphysema remains. Persistent airspace disease is noted in the bases bilaterally. This has increased slightly in the right lung base. No new focal abnormality is seen.  IMPRESSION: No pneumothorax noted.  Bibasilar infiltrates slightly increased on the right.  Tubes and lines as described.   Electronically Signed   By: Inez Catalina M.D.   On: 05/29/2015 07:45    Anti-infectives: Anti-infectives    None      Assessment/Plan: s/p Procedure(s): BEDSIDE VIDEO BRONCHOSCOPY (N/A) Chest x-ray  Will wean from vent - hopefully extubate later today    LOS: 3 days    Tiea Manninen K. 05/30/2015
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
## 2015-05-30 NOTE — Progress Notes (Signed)
Wasted 125 mL of Fentanyl down the sink with Hurshel Party, RN.

## 2015-05-30 NOTE — Procedures (Signed)
Extubation Procedure Note  Patient Details:   Name: Troy Robbins DOB: [birth year] MRN: [id number]   Airway Documentation:   Pt extubated per MD order to 4L [place] tolerating well, pt able to voice and in no distress at this time.  Evaluation  O2 sats: stable throughout Complications: No apparent complications Patient did tolerate procedure well. Bilateral Breath Sounds: Rhonchi Suctioning: Airway Yes  Ciro Backer 05/30/2015, 11:14 AM

## 2015-05-31 ENCOUNTER — Inpatient Hospital Stay (HOSPITAL_COMMUNITY): Payer: Self-pay

## 2015-05-31 DIAGNOSIS — R079 Chest pain, unspecified: Secondary | ICD-10-CM

## 2015-05-31 MED ORDER — POLYETHYLENE GLYCOL 3350 17 G PO PACK
17.0000 g | PACK | Freq: Every day | ORAL | Status: DC
  Administered 2015-05-31 – 2015-06-02 (×2): 17 g via ORAL
  Filled 2015-05-31 (×3): qty 1

## 2015-05-31 MED ORDER — DOCUSATE SODIUM 100 MG PO CAPS
100.0000 mg | ORAL_CAPSULE | Freq: Two times a day (BID) | ORAL | Status: DC
  Administered 2015-05-31 – 2015-06-02 (×3): 100 mg via ORAL
  Filled 2015-05-31 (×5): qty 1

## 2015-05-31 MED ORDER — METHOCARBAMOL 500 MG PO TABS
1000.0000 mg | ORAL_TABLET | Freq: Three times a day (TID) | ORAL | Status: DC | PRN
  Administered 2015-05-31 – 2015-06-01 (×2): 1000 mg via ORAL
  Filled 2015-05-31 (×3): qty 2

## 2015-05-31 MED ORDER — ENSURE ENLIVE PO LIQD
237.0000 mL | Freq: Two times a day (BID) | ORAL | Status: DC
  Administered 2015-06-01 – 2015-06-02 (×2): 237 mL via ORAL

## 2015-05-31 MED ORDER — OXYCODONE HCL 5 MG PO TABS
5.0000 mg | ORAL_TABLET | ORAL | Status: DC | PRN
  Administered 2015-05-31: 15 mg via ORAL
  Administered 2015-05-31: 10 mg via ORAL
  Administered 2015-05-31 – 2015-06-01 (×3): 15 mg via ORAL
  Filled 2015-05-31 (×4): qty 3
  Filled 2015-05-31: qty 2

## 2015-05-31 MED ORDER — IPRATROPIUM-ALBUTEROL 0.5-2.5 (3) MG/3ML IN SOLN: 3.0000 mL | Freq: Four times a day (QID) | RESPIRATORY_TRACT | Status: DC | PRN

## 2015-05-31 MED ORDER — IPRATROPIUM-ALBUTEROL 0.5-2.5 (3) MG/3ML IN SOLN
3.0000 mL | Freq: Four times a day (QID) | RESPIRATORY_TRACT | Status: DC
  Administered 2015-05-31 (×2): 3 mL via RESPIRATORY_TRACT
  Filled 2015-05-31 (×2): qty 3

## 2015-05-31 NOTE — Progress Notes (Signed)
  Echocardiogram 2D Echocardiogram has been performed.  Troy Robbins 05/31/2015, 5:57 PM
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
## 2015-05-31 NOTE — Progress Notes (Signed)
4 Days Post-Op Procedure(s) (LRB): BEDSIDE VIDEO BRONCHOSCOPY (N/A) Subjective: C/o pain Weak cough effort  Objective: Vital signs in last 24 hours: Temp:  [98.1 F (36.7 C)-102 F (38.9 C)] 98.8 F (37.1 C) (07/25 0700) Pulse Rate:  [60-104] 71 (07/25 0700) Cardiac Rhythm:  [-] Normal sinus rhythm (07/24 2000) Resp:  [15-36] 18 (07/25 0700) BP: (90-123)/(49-87) 100/61 mmHg (07/25 0700) SpO2:  [90 %-100 %] 92 % (07/25 0700) FiO2 (%):  [40 %] 40 % (07/24 0804)  Hemodynamic parameters for last 24 hours:    Intake/Output from previous day: 07/24 0701 - 07/25 0700 In: 3887.2 [P.O.:940; I.V.:2917.2; NG/GT:30] Out: 4105 [Urine:4045; Chest Tube:60] Intake/Output this shift:    General appearance: alert Neurologic: no foacla deficit Heart: regular rate and rhythm Lungs: diminished breath sounds bibasilar CT - no air leak  Lab Results:  Recent Labs  05/29/15 0217  WBC 11.4*  HGB 12.5*  HCT 37.3*  PLT 125*   BMET:  Recent Labs  05/29/15 0217  NA 137  K 3.7  CL 105  CO2 26  GLUCOSE 108*  BUN 6  CREATININE 1.04  CALCIUM 8.0*    PT/INR: No results for input(s): LABPROT, INR in the last 72 hours. ABG    Component Value Date/Time   PHART 7.375 05/29/2015 0500   HCO3 27.0* 05/29/2015 0500   TCO2 28.3 05/29/2015 0500   ACIDBASEDEF 4.0* 05/27/2015 1207   O2SAT 91.7 05/29/2015 0500   CBG (last 3)   Recent Labs  05/28/15 0952 05/28/15 1259 05/29/15 0749  GLUCAP 109* 104* 103*    Assessment/Plan: S/P Procedure(s) (LRB): BEDSIDE VIDEO BRONCHOSCOPY (N/A) - No air leak over past several days Now extubated CT placed to water seal. Very weak cough effort but no apparent air leak Will check a CXR later this AM, possibly dc CT later today   LOS: 4 days    Melrose Nakayama 05/31/2015
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
## 2015-05-31 NOTE — Progress Notes (Signed)
      [address]       [place] [address]             [phone]      No air leak this evening  CXR showed a small pneumo.  Recheck CXR in AM if [facility] and no leak will dc CT  [employer]. Roxan Hockey, MD [facility] [phone]

## 2015-06-01 ENCOUNTER — Inpatient Hospital Stay (HOSPITAL_COMMUNITY): Payer: MEDICAID

## 2015-06-01 ENCOUNTER — Inpatient Hospital Stay (HOSPITAL_COMMUNITY): Payer: Self-pay

## 2015-06-01 ENCOUNTER — Encounter (HOSPITAL_COMMUNITY): Payer: Self-pay | Admitting: *Deleted

## 2015-06-01 DIAGNOSIS — J95821 Acute postprocedural respiratory failure: Secondary | ICD-10-CM | POA: Diagnosis present

## 2015-06-01 DIAGNOSIS — J9382 Other air leak: Secondary | ICD-10-CM | POA: Diagnosis present

## 2015-06-01 DIAGNOSIS — D62 Acute posthemorrhagic anemia: Secondary | ICD-10-CM | POA: Diagnosis present

## 2015-06-01 DIAGNOSIS — T797XXA Traumatic subcutaneous emphysema, initial encounter: Secondary | ICD-10-CM | POA: Diagnosis present

## 2015-06-01 DIAGNOSIS — J939 Pneumothorax, unspecified: Secondary | ICD-10-CM | POA: Diagnosis present

## 2015-06-01 LAB — BASIC METABOLIC PANEL
Anion gap: 7 (ref 5–15)
BUN: <5 mg/dL — ABNORMAL LOW (ref 6–20)
CO2: 28 mmol/L (ref 22–32)
CREATININE: 0.6 mg/dL — AB (ref 0.61–1.24)
Calcium: 8.4 mg/dL — ABNORMAL LOW (ref 8.9–10.3)
Chloride: 99 mmol/L — ABNORMAL LOW (ref 101–111)
GFR calc non Af Amer: >60 mL/min (ref >60)
GLUCOSE: 102 mg/dL — AB (ref 65–99)
POTASSIUM: 3.6 mmol/L (ref 3.5–5.1)
Sodium: 134 mmol/L — ABNORMAL LOW (ref 135–145)

## 2015-06-01 MED ORDER — MENTHOL 3 MG MT LOZG
1.0000 | LOZENGE | OROMUCOSAL | Status: DC | PRN
  Filled 2015-06-01: qty 9

## 2015-06-01 MED ORDER — BISACODYL 10 MG RE SUPP: 10.0000 mg | Freq: Every day | RECTAL | Status: DC | PRN

## 2015-06-01 MED ORDER — TRAMADOL HCL 50 MG PO TABS
50.0000 mg | ORAL_TABLET | Freq: Four times a day (QID) | ORAL | Status: DC | PRN
  Filled 2015-06-01: qty 1

## 2015-06-01 MED ORDER — HYDROMORPHONE HCL 1 MG/ML IJ SOLN
1.0000 mg | INTRAMUSCULAR | Status: DC | PRN
  Administered 2015-06-01 – 2015-06-02 (×2): 1 mg via INTRAVENOUS
  Filled 2015-06-01 (×2): qty 1
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
## 2015-06-01 NOTE — Progress Notes (Signed)
      [address]       [place] [address]             [phone]      Still c/o pain at CT site  BP 103/63 mmHg  Pulse 66  Temp(Src) 98.6 F (37 C) (Oral)  Resp 24  Ht 5' 8.9" (1.75 m)  Wt 148 lb 5.9 oz (67.3 kg)  BMI 21.98 kg/m2  SpO2 95%   Intake/Output Summary (Last 24 hours) at 06/01/15 0754 Last data filed at 06/01/15 0700  Gross per 24 hour  Intake 1627.5 ml  Output    295 ml  Net 1332.5 ml    No air leak with cough Chest x-ray unchanged  Will dc CT today Transferring to step down  Remo Lipps C. Roxan Hockey, MD [facility] [phone]
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
## 2015-06-01 NOTE — Progress Notes (Signed)
PT Cancellation Note  Patient Details Name: Troy Robbins MRN: [id number] DOB: [birth year]   Cancelled Treatment:    Reason Eval/Treat Not Completed: Medical issues which prohibited therapy. Pt reports nausea despite having nausea meds recently. Provided rationale/benefits of mobility and he continued to refuse mobility. Asked if he felt he was mobilizing well enough to go stay in his friends? carport and he replied, "it is what it is." Asked if he felt he might need a cane or crutches and he does not want either. RN updated. Will see as schedule permits (did note he did well walking with OT earlier today--see OT eval).   Quaniya Damas 06/01/2015, 1:16 PM  Pager [phone]

## 2015-06-02 ENCOUNTER — Inpatient Hospital Stay (HOSPITAL_COMMUNITY): Payer: Self-pay

## 2015-06-02 DIAGNOSIS — R1310 Dysphagia, unspecified: Secondary | ICD-10-CM

## 2015-06-02 DIAGNOSIS — J860 Pyothorax with fistula: Secondary | ICD-10-CM | POA: Diagnosis present

## 2015-06-02 MED ORDER — ADULT MULTIVITAMIN W/MINERALS CH: 1.0000 | ORAL_TABLET | Freq: Every day | ORAL | Status: DC

## 2015-06-02 MED ORDER — DOCUSATE SODIUM 100 MG PO CAPS: 100.0000 mg | ORAL_CAPSULE | Freq: Two times a day (BID) | ORAL | Status: DC

## 2015-06-02 MED ORDER — FOLIC ACID 1 MG PO TABS: 1.0000 mg | ORAL_TABLET | Freq: Every day | ORAL | Status: DC

## 2015-06-02 MED ORDER — POLYETHYLENE GLYCOL 3350 17 G PO PACK: 17.0000 g | PACK | Freq: Every day | ORAL | Status: DC

## 2015-06-02 MED ORDER — THIAMINE HCL 100 MG PO TABS: 100.0000 mg | ORAL_TABLET | Freq: Every day | ORAL | Status: DC

## 2015-06-02 MED ORDER — ACETAMINOPHEN 325 MG PO TABS: 650.0000 mg | ORAL_TABLET | Freq: Four times a day (QID) | ORAL | Status: DC | PRN

## 2015-06-02 MED ORDER — METHOCARBAMOL 500 MG PO TABS: 1000.0000 mg | ORAL_TABLET | Freq: Three times a day (TID) | ORAL | Status: DC | PRN

## 2015-06-02 MED ORDER — OXYCODONE HCL 5 MG PO TABS: 5.0000 mg | ORAL_TABLET | Freq: Four times a day (QID) | ORAL | Status: DC | PRN
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
## 2015-06-02 NOTE — Care Management Note (Signed)
Case Management Note  Patient Details  Name: Troy Robbins MRN: [id number] Date of Birth: 12-Jan-1971  Subjective/Objective:                    Action/Plan:  Patient presently off unit having test done . Left MATCH letter and [facility] with RN who will give to patient on his return. Expected Discharge Date:               Expected Discharge Plan:  Home/Self Care  In-House Referral:     Discharge planning Services  CM Consult, [place] [facility], Medication Assistance, [facility]  Post Acute Care Choice:    Choice offered to:     DME Arranged:    DME Agency:     HH Arranged:    [place] Agency:     Status of Service:  Completed, signed off  Medicare Important Message Given:    Date Medicare IM Given:    Medicare IM give by:    Date Additional Medicare IM Given:    Additional Medicare Important Message give by:     If discussed at [place] of Stay Meetings, dates discussed:    Additional Comments:  Marilu Favre, RN 06/02/2015, 11:08 AM

## 2015-06-02 NOTE — Progress Notes (Signed)
Removed sutures to upper left chest per md order. Pt tolerated procedure well. Site is approximated, pink.
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
## 2015-06-02 NOTE — Progress Notes (Signed)
MBSS complete. Full report located under chart review in imaging section.  Casmir Auguste Paiewonsky, M.A. CCC-SLP [phone]  

## 2015-06-02 NOTE — Progress Notes (Signed)
Discussed discharge instructions and medications with patient. Notified patient of his follow up appointment. He verbalized understanding with all questions answered. VSS. Pt states he has a friend picking him up but also discharged with a bus pass.   [facility]
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
## 2015-06-02 NOTE — Progress Notes (Signed)
6 Days Post-Op Procedure(s) (LRB): BEDSIDE VIDEO BRONCHOSCOPY (N/A) Subjective: C/o coughing after eating and drinking  Objective: Vital signs in last 24 hours: Temp:  [98.2 F (36.8 C)-99.5 F (37.5 C)] 98.9 F (37.2 C) (07/27 0332) Pulse Rate:  [61-71] 68 (07/27 0332) Cardiac Rhythm:  [-] Normal sinus rhythm (07/27 0332) Resp:  [17-24] 24 (07/27 0332) BP: (108-149)/(61-131) 110/68 mmHg (07/27 0332) SpO2:  [90 %-97 %] 97 % (07/27 0332)  Hemodynamic parameters for last 24 hours:    Intake/Output from previous day: 07/26 0701 - 07/27 0700 In: 600 [P.O.:600] Out: -  Intake/Output this shift:    General appearance: alert, cooperative and no distress Lungs: coarse BS, equal bilaterally  Lab Results: No results for input(s): WBC, HGB, HCT, PLT in the last 72 hours. BMET:  Recent Labs  06/01/15 0300  NA 134*  K 3.6  CL 99*  CO2 28  GLUCOSE 102*  BUN <5*  CREATININE 0.60*  CALCIUM 8.4*    PT/INR: No results for input(s): LABPROT, INR in the last 72 hours. ABG    Component Value Date/Time   PHART 7.375 05/29/2015 0500   HCO3 27.0* 05/29/2015 0500   TCO2 28.3 05/29/2015 0500   ACIDBASEDEF 4.0* 05/27/2015 1207   O2SAT 91.7 05/29/2015 0500   CBG (last 3)  No results for input(s): GLUCAP in the last 72 hours.  CHEST 2 VIEW  COMPARISON: Portable chest x-ray of June 01, 2015  FINDINGS: The lungs are mildly hyperinflated. There is persistent bibasilar pneumonia posteriorly greatest on the right. Small amounts of pleural fluid blunt the costophrenic angles. The left apical pneumothorax has resolved. The heart and pulmonary vascularity are normal. The bony thorax exhibits no acute abnormality.  IMPRESSION: Interval resolution of the left apical pneumothorax. There remain bibasilar atelectatic changes greater on the right than on the left. Small pleural effusions persist bilaterally.   Electronically Signed  By: David Martinique M.D.  On: 06/02/2015  07:43  Assessment/Plan: S/P Procedure(s) (LRB): BEDSIDE VIDEO BRONCHOSCOPY (N/A) - Chest tube removed yesterday. Chest xray today shows no pneumothorax  C/o cough with swallowing- ? Aspiration- will defer to trauma service  Please call if we can be of any further assistance    LOS: 6 days    Melrose Nakayama 06/02/2015

## 2015-06-20 ENCOUNTER — Emergency Department (HOSPITAL_COMMUNITY)
Admission: EM | Admit: 2015-06-20 | Discharge: 2015-06-20 | Disposition: A | Discharge status: Home / Self Care | Payer: Self-pay | Attending: Emergency Medicine | Admitting: Emergency Medicine

## 2015-06-20 ENCOUNTER — Encounter (HOSPITAL_COMMUNITY): Payer: Self-pay | Admitting: *Deleted

## 2015-06-20 DIAGNOSIS — Z4802 Encounter for removal of sutures: Secondary | ICD-10-CM | POA: Insufficient documentation

## 2015-06-20 DIAGNOSIS — Z79899 Other long term (current) drug therapy: Secondary | ICD-10-CM | POA: Insufficient documentation

## 2015-06-20 DIAGNOSIS — Z720 Tobacco use: Secondary | ICD-10-CM | POA: Insufficient documentation

## 2015-06-20 DIAGNOSIS — R070 Pain in throat: Secondary | ICD-10-CM | POA: Insufficient documentation

## 2015-06-20 DIAGNOSIS — J392 Other diseases of pharynx: Secondary | ICD-10-CM

## 2015-06-20 DIAGNOSIS — Z8719 Personal history of other diseases of the digestive system: Secondary | ICD-10-CM | POA: Insufficient documentation

## 2015-06-20 MED ORDER — BENZONATATE 100 MG PO CAPS
200.0000 mg | ORAL_CAPSULE | Freq: Once | ORAL | Status: AC
  Administered 2015-06-20: 200 mg via ORAL
  Filled 2015-06-20: qty 2

## 2015-06-20 MED ORDER — BENZONATATE 100 MG PO CAPS: 200.0000 mg | ORAL_CAPSULE | Freq: Three times a day (TID) | ORAL | Status: DC | PRN

## 2015-06-20 NOTE — ED Notes (Signed)
Patient here for suture removal of left chest. States they have been there since July 21st. Unsure of when he was told to come back for removal.
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
## 2015-06-20 NOTE — Discharge Instructions (Signed)

## 2015-06-24 ENCOUNTER — Encounter (HOSPITAL_COMMUNITY): Payer: Self-pay | Admitting: Emergency Medicine

## 2015-06-24 ENCOUNTER — Emergency Department (HOSPITAL_COMMUNITY)
Admission: EM | Admit: 2015-06-24 | Discharge: 2015-06-24 | Disposition: A | Discharge status: Home / Self Care | Payer: Self-pay | Attending: Physician Assistant | Admitting: Physician Assistant

## 2015-06-24 DIAGNOSIS — X58XXXA Exposure to other specified factors, initial encounter: Secondary | ICD-10-CM | POA: Insufficient documentation

## 2015-06-24 DIAGNOSIS — Y999 Unspecified external cause status: Secondary | ICD-10-CM | POA: Insufficient documentation

## 2015-06-24 DIAGNOSIS — Y929 Unspecified place or not applicable: Secondary | ICD-10-CM | POA: Insufficient documentation

## 2015-06-24 DIAGNOSIS — L03031 Cellulitis of right toe: Secondary | ICD-10-CM | POA: Insufficient documentation

## 2015-06-24 DIAGNOSIS — Z79899 Other long term (current) drug therapy: Secondary | ICD-10-CM | POA: Insufficient documentation

## 2015-06-24 DIAGNOSIS — S1081XA Abrasion of other specified part of neck, initial encounter: Secondary | ICD-10-CM | POA: Insufficient documentation

## 2015-06-24 DIAGNOSIS — Y939 Activity, unspecified: Secondary | ICD-10-CM | POA: Insufficient documentation

## 2015-06-24 DIAGNOSIS — Z720 Tobacco use: Secondary | ICD-10-CM | POA: Insufficient documentation

## 2015-06-24 DIAGNOSIS — Z8719 Personal history of other diseases of the digestive system: Secondary | ICD-10-CM | POA: Insufficient documentation

## 2015-06-24 MED ORDER — ACETAMINOPHEN 325 MG PO TABS
650.0000 mg | ORAL_TABLET | Freq: Once | ORAL | Status: AC
  Administered 2015-06-24: 650 mg via ORAL
  Filled 2015-06-24: qty 2

## 2015-06-24 MED ORDER — CEPHALEXIN 500 MG PO CAPS
500.0000 mg | ORAL_CAPSULE | Freq: Once | ORAL | Status: AC
  Administered 2015-06-24: 500 mg via ORAL
  Filled 2015-06-24: qty 1

## 2015-06-24 MED ORDER — CEPHALEXIN 500 MG PO CAPS: 500.0000 mg | ORAL_CAPSULE | Freq: Two times a day (BID) | ORAL | Status: DC

## 2015-06-24 NOTE — ED Provider Notes (Signed)
CSN: [id number]     Arrival date & time 06/24/15  1442 History   First MD Initiated Contact with Patient 06/24/15 1458     Chief Complaint  Patient presents with  . Multiple Complaints      (Consider location/radiation/quality/duration/timing/severity/associated sxs/prior Treatment) HPI   Patient is a 44 year old male here with his friend both for multiple complaints. Patient states that he has chronic right foot pain and right foot swelling since 2009. There is nothing new about the pain. He does says it is a little warmer than usual and thinks it might be infected. He also has a small abrasion to his lateral neck that he is  concerned represents a worm from a cat. He says he saw on the Internet that sometimes worms burrow under the skin. Patient is eating a [employer] during this interaction.  Past Medical History  Diagnosis Date  . Seasonal allergies   . Acid reflux    Past Surgical History  Procedure Laterality Date  . Video bronchoscopy N/A 05/27/2015    Procedure: BEDSIDE VIDEO BRONCHOSCOPY;  Surgeon: Melrose Nakayama, MD;  Location: [facility];  Service: Thoracic;  Laterality: N/A;  . No past surgeries     History reviewed. No pertinent family history. Social History  Substance Use Topics  . Smoking status: Current Every Day Smoker -- 1.00 packs/day    Types: Cigarettes  . Smokeless tobacco: None  . Alcohol Use: Yes     Comment: Occ    Review of Systems  Constitutional: Negative for activity change.  Respiratory: Negative for shortness of breath.   Cardiovascular: Negative for chest pain.  Gastrointestinal: Negative for abdominal pain.      Allergies  Review of patient's allergies indicates no known allergies.  Home Medications   Prior to Admission medications   Medication Sig Start Date End Date Taking? Authorizing Provider  acetaminophen (TYLENOL) 325 MG tablet Take 2 tablets (650 mg total) by mouth every 6 (six) hours as needed for mild pain. 06/02/15   Nat Christen, PA-C  benzonatate (TESSALON) 100 MG capsule Take 2 capsules (200 mg total) by mouth 3 (three) times daily as needed. 06/20/15   Evalee Jefferson, PA-C  docusate sodium (COLACE) 100 MG capsule Take 1 capsule (100 mg total) by mouth 2 (two) times daily. 06/02/15   Nat Christen, PA-C  folic acid (FOLVITE) 1 MG tablet Take 1 tablet (1 mg total) by mouth daily. 06/02/15   Nat Christen, PA-C  methocarbamol (ROBAXIN) 500 MG tablet Take 2 tablets (1,000 mg total) by mouth every 8 (eight) hours as needed for muscle spasms (or pain). 06/02/15   Nat Christen, PA-C  Multiple Vitamin (MULTIVITAMIN WITH MINERALS) TABS tablet Take 1 tablet by mouth daily. 06/02/15   Nat Christen, PA-C  oxyCODONE (OXY IR/ROXICODONE) 5 MG immediate release tablet Take 1-3 tablets (5-15 mg total) by mouth every 6 (six) hours as needed for moderate pain, severe pain or breakthrough pain. 06/02/15   Nat Christen, PA-C  polyethylene glycol (MIRALAX / GLYCOLAX) packet Take 17 g by mouth daily. 06/02/15   Nat Christen, PA-C  thiamine 100 MG tablet Take 1 tablet (100 mg total) by mouth daily. 06/02/15   Nat Christen, PA-C   BP 119/3 mmHg  Pulse 83  Temp(Src) 98.4 F (36.9 C) (Oral)  Resp 20  Ht 5\' 9"  (1.753 m)  Wt 145 lb (65.772 kg)  BMI 21.40 kg/m2  SpO2 100% Physical Exam  Constitutional: He is  oriented to person, place, and time. He appears well-nourished.  HENT:  Head: Normocephalic.  Small linea erythema to the lateral R neck, appears chronic  Eyes: Conjunctivae are normal.  Cardiovascular: Normal rate.   Pulmonary/Chest: Effort normal.  Musculoskeletal:  Right lower extremity looks chronically swollen. Small area of erythema surrounding a cut. Normal range of motion. No pain with palpation or range of motion.  Neurological: He is oriented to person, place, and time.  Skin: Skin is warm and dry. He is not diaphoretic.  Psychiatric: He has a normal mood and affect. His behavior is normal.    ED Course  Procedures  (including critical care time) Labs Review Labs Reviewed - No data to display  Imaging Review No results found. I have personally reviewed and evaluated these images and lab results as part of my medical decision-making.   EKG Interpretation None      MDM   Final diagnoses:  None   patient is a 44 year old male here with multiple complaints. Patient is concerned about his right foot. We'll give him a short course of Keflex. Patient is also concerned about the right neck. I do not think this represents any kind worm, or dangerous entity that needs intervening upon. It appears chronic.  We will recommend to follow with a regualr physician.  Patient is comfortable, ambulatory, and taking PO at time of discharge.  Patient expressed understanding about return precautions.      Jesse Hirst Julio Alm, MD 06/24/15 [phone]

## 2015-06-24 NOTE — ED Notes (Signed)
MD Mackuen at bedside.  

## 2015-06-24 NOTE — Discharge Instructions (Signed)
Cellulitis °Cellulitis is an infection of the skin and the tissue beneath it. The infected area is usually red and tender. Cellulitis occurs most often in the arms and lower legs.  °CAUSES  °Cellulitis is caused by bacteria that enter the skin through cracks or cuts in the skin. The most common types of bacteria that cause cellulitis are staphylococci and streptococci. °SIGNS AND SYMPTOMS  °· Redness and warmth. °· Swelling. °· Tenderness or pain. °· Fever. °DIAGNOSIS  °Your health care provider can usually determine what is wrong based on a physical exam. Blood tests may also be done. °TREATMENT  °Treatment usually involves taking an antibiotic medicine. °HOME CARE INSTRUCTIONS  °· Take your antibiotic medicine as directed by your health care provider. Finish the antibiotic even if you start to feel better. °· Keep the infected arm or leg elevated to reduce swelling. °· Apply a warm cloth to the affected area up to 4 times per day to relieve pain. °· Take medicines only as directed by your health care provider. °· Keep all follow-up visits as directed by your health care provider. °SEEK MEDICAL CARE IF:  °· You notice red streaks coming from the infected area. °· Your red area gets larger or turns dark in color. °· Your bone or joint underneath the infected area becomes painful after the skin has healed. °· Your infection returns in the same area or another area. °· You notice a swollen bump in the infected area. °· You develop new symptoms. °· You have a fever. °SEEK IMMEDIATE MEDICAL CARE IF:  °· You feel very sleepy. °· You develop vomiting or diarrhea. °· You have a general ill feeling (malaise) with muscle aches and pains. °MAKE SURE YOU:  °· Understand these instructions. °· Will watch your condition. °· Will get help right away if you are not doing well or get worse. °Document Released: 08/02/2005 Document Revised: 03/09/2014 Document Reviewed: 01/08/2012 °ExitCare® Patient Information ©2015 [employer].  This information is not intended to replace advice given to you by your health care provider. Make sure you discuss any questions you have with your health care provider. ° ° °Emergency Department Resource Guide °1) Find a Doctor and Pay Out of Pocket °Although you won't have to find out who is covered by your insurance plan, it is a good idea to ask around and get recommendations. You will then need to call the office and see if the doctor you have chosen will accept you as a new patient and what types of options they offer for patients who are self-pay. Some doctors offer discounts or will set up payment plans for their patients who do not have insurance, but you will need to ask so you aren't surprised when you get to your appointment. ° °2) Contact Your Local Health Department °Not all health departments have doctors that can see patients for sick visits, but many do, so it is worth a call to see if yours does. If you don't know where your local health department is, you can check in your phone book. The CDC also has a tool to help you locate your state's health department, and many state websites also have listings of all of their local health departments. ° °3) Find a Walk-in Clinic °If your illness is not likely to be very severe or complicated, you may want to try a walk in clinic. These are popping up all over the country in pharmacies, drugstores, and shopping centers. They're usually staffed by nurse practitioners   physician assistants that have been trained to treat common illnesses and complaints. They're usually fairly quick and inexpensive. However, if you have serious medical issues or chronic medical problems, these are probably not your best option.  No Primary Care Doctor: - Call Health Connect at  [phone] - they can help you locate a primary care doctor that  accepts your insurance, provides certain services, etc. - Physician Referral Service- [phone]  Chronic Pain  Problems: Organization         Address  Phone   Notes  [facility]  [phone] Patients need to be referred by their primary care doctor.   Medication Assistance: Organization         Address  Phone   Notes  [facility] Medication [facility] [address] [place], [place] [address] [phone] --Must be a resident of [facility] -- Must have NO insurance coverage whatsoever (no Medicaid/ Medicare, etc.) -- The pt. MUST have a primary care doctor that directs their care regularly and follows them in the community   [facility]ist  [phone]   [employer]  [phone]    Agencies that provide inexpensive medical care: Organization         Address  Phone   Notes  [place]  [phone]   [facility]    [phone]   [facility] [place], [place] [address] [phone]   [place] [address] [place] [phone]   Planned Parenthood    [phone]   [facility]    [phone]   [place] and [place] [address], [place] Phone:  [phone], Fax:  [phone] Hours of Operation:  9 am - 6 pm, M-F.  Also accepts Medicaid/Medicare and self-pay.  [facility] for [place], [address], [place] Phone: [phone], Fax: [phone]. Hours of Operation:  8:30 am - 5:30 pm, M-F.  Also accepts Medicaid and self-pay.  [facility] [address], [place] Phone: [phone]   [place], [place], [place] [phone], Ext. [phone] Mondays & Thursdays: 7-9 AM.  First 15 patients are seen on a first come, first serve basis.    [place] Providers:  Organization         Address  Phone   Notes  [facility] [address], [place] [phone] Also  accepts self-pay patients.  [facility] [address], [place]  [phone]   [place], [address], [place] [phone]   [facility] [address] [place] [phone]   Lucianne Lei [address], [place]   [phone] Only accepts [place] Access [place] patients after they have their name applied to their card.   Self-Pay (no insurance) in [facility]:  Organization         Address  Phone   Notes  Sickle Cell Patients, [facility] [place] [phone]   [facility] [place] [phone]   [facility] [place]  [address], [place] [phone]   [facility]/Dr. Vista Lawman  [address], [place] or [address], [place] [phone] Phone number for both [place] and [place] locations is the same.  Urgent Medical and [facility] [address], [place] [phone]   [facility] [address] [place] or [address] [phone]   [facility] [address], [place] [phone], phone; [phone], fax Sees patients 1st and 3rd Saturday of every month.  Must not qualify for public or private insurance (i.e. Medicaid, Medicare, [place] Health Choice, Veterans' Benefits)  Household income should be no more than 200% of the poverty level The clinic cannot treat you if you are pregnant or think you are pregnant  Sexually transmitted diseases are not treated at the clinic.    Dental Care: Organization         Address  Phone  Notes  [facility] Department of [facility] [phone] Accepts children up to age 78 who are enrolled in [place] or [place]; pregnant  women with a Medicaid card; and children who have applied for Medicaid or [place] Health Choice, but were declined, whose parents can pay a reduced fee at time of service.  [facility] Department of [facility]  [address], [place] [phone] Accepts children up to age 45 who are enrolled in [place] or [place]; pregnant women with a Medicaid card; and children who have applied for Medicaid or [place] Health Choice, but were declined, whose parents can pay a reduced fee at time of service.  [place] Adult Dental Access PROGRAM  [place] [phone] Patients are seen by appointment only. Walk-ins are not accepted. [place] will see patients 21 years of age and older. Monday - Tuesday (8am-5pm) Most Wednesdays (8:30-5pm) $30 per visit, cash only  [facility] Adult Dental Access PROGRAM  [address], [facility] [phone] Patients are seen by appointment only. Walk-ins are not accepted. [place] will see patients 72 years of age and older. One Wednesday Evening (Monthly: Volunteer Based).  $30 per visit, cash only  [place]  [phone] for adults; Children under age 20, call Graduate Pediatric Dentistry at [phone]. Children aged 63-14, please call [phone] to request a pediatric application.  Dental services are provided in all areas of dental care including fillings, crowns and bridges, complete and partial dentures, implants, gum treatment, root canals, and extractions. Preventive care is also provided. Treatment is provided to both adults and children. Patients are selected via a lottery and there is often a waiting list.   [facility] [address], [place]  [phone] [email or web address]   [facility] [address] [place], [place] [phone], Ext. [phone] Second and Fourth Thursday of each month, opens at 6:30 AM; Clinic ends at 9 AM.  Patients are  seen on a first-come first-served basis, and a limited number are seen during each clinic.   [facility]  [address] Hillard Danker [place], [place] [phone]   Eligibility Requirements You must have lived in [place], [place], or [place] counties for at least the last three months.   You cannot be eligible for state or federal sponsored [employer], including [employer], [place], or [employer].   You generally cannot be eligible for healthcare insurance through your employer.    How  to apply: Eligibility screenings are held every Tuesday and Wednesday afternoon from 1:00 pm until 4:00 pm. You do not need an appointment for the interview!  [facility] [address], [place], [place]   [place]  [place] Department  [place]  [phone]    Behavioral Health Resources in the Community: Intensive Outpatient Programs Organization         Address  Phone  Notes  [place]. [address], [place], [place] [phone]   [facility] Outpatient [address] [place], [place]   ADS: Alcohol & Drug Svcs [address], [place], [place]   [place] [address]  [place], [place] or [phone]   Substance Abuse Resources Organization         Address  Phone  Notes  Alcohol and Drug Services  [phone]   [place]  [phone]   [facility]   Chinita Pester  [phone]   Residential & Outpatient Substance Abuse Program  [phone]   Psychological Services Organization         Address  Phone  Notes  [facility] [place]  [place]  [phone]   [place] [address], [place] or [phone]    Mobile Crisis  Teams Organization         Address  Phone  Notes  [facility], Mobile Crisis Care Unit  [phone]   [facility]  [address] [place], [place]   [place] [address] [place] [phone]    Self-Help/Support Groups Organization         Address  Phone             Notes  [place]. of [place] - variety of support groups  [place] Call for more information  Narcotics Anonymous (NA), [facility] [address], [employer] [place]  2 meetings at this location   Special educational needs teacher         Address  Phone  Notes  [facility] Residential Treatment [place],    [place]  [phone]   [facility]  [address] [place] [phone], [place], [place]   [place], [place] [phone] Admissions: 8am-3pm M-F  Incentives Substance [place] [address]    [place], [place] [phone]   [facility] [address] [place], [place], [place]   [facility] [address],  [place], [place]   [facility] - Intensive Outpatient [address] Kristeen [address], [place], [place]   [facility] ([place].,  [place], [place] [phone] or [phone]   [facility] Services (RTS) [address], [place], [place] Accepts Medicaid  [facility] [address]  [place] [phone] Substance Abuse/Addiction Treatment   [facility] Organization         Address  Phone  Notes  [facility]  [phone]   Domenic Schwab, PhD [address] Arlis Porta [place], [place]   [phone] or [phone]   [place]   [address] [place], [place] [phone]   [facility] [place] [address] [place],  [place] [phone]  Insurance/Medicaid/sponsorship through [facility] and Families [address], [address] [place], [place] [phone] [place], [place] [phone]    Dr. Adele Schilder  [phone]   [facility] Dept. 1) [address], [place] 2) [place] 3)  [place] [address], [place] [phone]  [phone]   [place] [phone] or [phone] (After Hours)

## 2015-06-24 NOTE — ED Notes (Signed)
Pt states he is here for evaluation of right sided abdominal pain, rash on neck, right foot pain and swelling.

## 2015-07-23 ENCOUNTER — Encounter (HOSPITAL_COMMUNITY): Payer: Self-pay | Admitting: *Deleted

## 2015-07-23 ENCOUNTER — Emergency Department (HOSPITAL_COMMUNITY): Payer: Self-pay

## 2015-07-23 ENCOUNTER — Inpatient Hospital Stay (HOSPITAL_COMMUNITY)
Admission: EM | Admit: 2015-07-23 | Discharge: 2015-07-24 | DRG: 204 | Disposition: A | Discharge status: Home / Self Care | Payer: Self-pay | Attending: Otolaryngology | Admitting: Otolaryngology

## 2015-07-23 DIAGNOSIS — J302 Other seasonal allergic rhinitis: Secondary | ICD-10-CM | POA: Diagnosis present

## 2015-07-23 DIAGNOSIS — D490 Neoplasm of unspecified behavior of digestive system: Secondary | ICD-10-CM

## 2015-07-23 DIAGNOSIS — R0602 Shortness of breath: Secondary | ICD-10-CM

## 2015-07-23 DIAGNOSIS — K219 Gastro-esophageal reflux disease without esophagitis: Secondary | ICD-10-CM | POA: Diagnosis present

## 2015-07-23 DIAGNOSIS — Z23 Encounter for immunization: Secondary | ICD-10-CM

## 2015-07-23 DIAGNOSIS — R061 Stridor: Principal | ICD-10-CM | POA: Diagnosis present

## 2015-07-23 DIAGNOSIS — F1490 Cocaine use, unspecified, uncomplicated: Secondary | ICD-10-CM | POA: Diagnosis present

## 2015-07-23 DIAGNOSIS — F1990 Other psychoactive substance use, unspecified, uncomplicated: Secondary | ICD-10-CM | POA: Diagnosis present

## 2015-07-23 DIAGNOSIS — Z590 Homelessness: Secondary | ICD-10-CM

## 2015-07-23 DIAGNOSIS — F17210 Nicotine dependence, cigarettes, uncomplicated: Secondary | ICD-10-CM | POA: Diagnosis present

## 2015-07-23 DIAGNOSIS — F1099 Alcohol use, unspecified with unspecified alcohol-induced disorder: Secondary | ICD-10-CM | POA: Diagnosis present

## 2015-07-23 LAB — CBC
HCT: 46.3 % (ref 39.0–52.0)
Hemoglobin: 15.6 g/dL (ref 13.0–17.0)
MCH: 32.8 pg (ref 26.0–34.0)
MCHC: 33.7 g/dL (ref 30.0–36.0)
MCV: 97.3 fL (ref 78.0–100.0)
PLATELETS: 243 10*3/uL (ref 150–400)
RBC: 4.76 MIL/uL (ref 4.22–5.81)
RDW: 13.4 % (ref 11.5–15.5)
WBC: 5.2 10*3/uL (ref 4.0–10.5)

## 2015-07-23 LAB — CBC WITH DIFFERENTIAL/PLATELET
BASOS ABS: 0 10*3/uL (ref 0.0–0.1)
Basophils Relative: 1 %
Eosinophils Absolute: 0.2 10*3/uL (ref 0.0–0.7)
Eosinophils Relative: 2 %
HEMATOCRIT: 47.1 % (ref 39.0–52.0)
HEMOGLOBIN: 15.9 g/dL (ref 13.0–17.0)
LYMPHS PCT: 22 %
Lymphs Abs: 1.9 10*3/uL (ref 0.7–4.0)
MCH: 32.9 pg (ref 26.0–34.0)
MCHC: 33.8 g/dL (ref 30.0–36.0)
MCV: 97.3 fL (ref 78.0–100.0)
MONO ABS: 0.7 10*3/uL (ref 0.1–1.0)
Monocytes Relative: 8 %
NEUTROS ABS: 5.7 10*3/uL (ref 1.7–7.7)
NEUTROS PCT: 67 %
Platelets: 240 10*3/uL (ref 150–400)
RBC: 4.84 MIL/uL (ref 4.22–5.81)
RDW: 13.1 % (ref 11.5–15.5)
WBC: 8.5 10*3/uL (ref 4.0–10.5)

## 2015-07-23 LAB — BASIC METABOLIC PANEL
ANION GAP: 7 (ref 5–15)
BUN: 9 mg/dL (ref 6–20)
CO2: 33 mmol/L — ABNORMAL HIGH (ref 22–32)
Calcium: 9.2 mg/dL (ref 8.9–10.3)
Chloride: 104 mmol/L (ref 101–111)
Creatinine, Ser: 0.64 mg/dL (ref 0.61–1.24)
GFR calc Af Amer: >60 mL/min (ref >60)
GLUCOSE: 101 mg/dL — AB (ref 65–99)
POTASSIUM: 3.8 mmol/L (ref 3.5–5.1)
Sodium: 144 mmol/L (ref 135–145)

## 2015-07-23 LAB — CREATININE, SERUM: CREATININE: 0.72 mg/dL (ref 0.61–1.24)

## 2015-07-23 LAB — MRSA PCR SCREENING: MRSA by PCR: NEGATIVE

## 2015-07-23 MED ORDER — PNEUMOCOCCAL VAC POLYVALENT 25 MCG/0.5ML IJ INJ
0.5000 mL | INJECTION | INTRAMUSCULAR | Status: AC
  Administered 2015-07-24: 0.5 mL via INTRAMUSCULAR
  Filled 2015-07-23: qty 0.5

## 2015-07-23 MED ORDER — ALBUTEROL SULFATE (2.5 MG/3ML) 0.083% IN NEBU
5.0000 mg | INHALATION_SOLUTION | Freq: Once | RESPIRATORY_TRACT | Status: AC
  Administered 2015-07-23: 5 mg via RESPIRATORY_TRACT
  Filled 2015-07-23: qty 6

## 2015-07-23 MED ORDER — PANTOPRAZOLE SODIUM 40 MG IV SOLR
40.0000 mg | Freq: Two times a day (BID) | INTRAVENOUS | Status: DC
  Administered 2015-07-23 – 2015-07-24 (×2): 40 mg via INTRAVENOUS
  Filled 2015-07-23 (×2): qty 40

## 2015-07-23 MED ORDER — AMPICILLIN-SULBACTAM SODIUM 3 (2-1) G IJ SOLR
3.0000 g | Freq: Once | INTRAMUSCULAR | Status: AC
  Administered 2015-07-23: 3 g via INTRAVENOUS
  Filled 2015-07-23: qty 3

## 2015-07-23 MED ORDER — IOHEXOL 300 MG/ML  SOLN
75.0000 mL | Freq: Once | INTRAMUSCULAR | Status: AC | PRN
  Administered 2015-07-23: 75 mL via INTRAVENOUS

## 2015-07-23 MED ORDER — HYDROCODONE-ACETAMINOPHEN 7.5-325 MG/15ML PO SOLN: 10.0000 mL | ORAL | Status: DC | PRN

## 2015-07-23 MED ORDER — MORPHINE SULFATE (PF) 2 MG/ML IV SOLN: 2.0000 mg | INTRAVENOUS | Status: DC | PRN

## 2015-07-23 MED ORDER — RACEPINEPHRINE HCL 2.25 % IN NEBU
0.5000 mL | INHALATION_SOLUTION | Freq: Once | RESPIRATORY_TRACT | Status: AC
  Administered 2015-07-23: 0.5 mL via RESPIRATORY_TRACT
  Filled 2015-07-23: qty 0.5

## 2015-07-23 MED ORDER — DEXAMETHASONE SODIUM PHOSPHATE 4 MG/ML IJ SOLN
10.0000 mg | Freq: Once | INTRAMUSCULAR | Status: AC
  Administered 2015-07-23: 10 mg via INTRAVENOUS
  Filled 2015-07-23: qty 3

## 2015-07-23 MED ORDER — DEXTROSE-NACL 5-0.45 % IV SOLN
INTRAVENOUS | Status: DC
  Administered 2015-07-23: 13:00:00 via INTRAVENOUS

## 2015-07-23 MED ORDER — SODIUM CHLORIDE 0.9 % IN NEBU
INHALATION_SOLUTION | RESPIRATORY_TRACT | Status: AC
  Administered 2015-07-23: 06:00:00
  Filled 2015-07-23: qty 3

## 2015-07-23 MED ORDER — KCL IN DEXTROSE-NACL 20-5-0.45 MEQ/L-%-% IV SOLN
INTRAVENOUS | Status: DC
  Administered 2015-07-23: via INTRAVENOUS
  Administered 2015-07-23: 100 mL/h via INTRAVENOUS
  Filled 2015-07-23 (×4): qty 1000

## 2015-07-23 MED ORDER — SODIUM CHLORIDE 0.9 % IV SOLN
INTRAVENOUS | Status: DC
  Administered 2015-07-23: 06:00:00 via INTRAVENOUS

## 2015-07-23 MED ORDER — PROMETHAZINE HCL 25 MG PO TABS: 12.5000 mg | ORAL_TABLET | Freq: Four times a day (QID) | ORAL | Status: DC | PRN

## 2015-07-23 MED ORDER — PROMETHAZINE HCL 25 MG RE SUPP: 12.5000 mg | Freq: Four times a day (QID) | RECTAL | Status: DC | PRN

## 2015-07-23 MED ORDER — SODIUM CHLORIDE 0.9 % IV SOLN
3.0000 g | Freq: Four times a day (QID) | INTRAVENOUS | Status: DC
  Administered 2015-07-23 – 2015-07-24 (×5): 3 g via INTRAVENOUS
  Filled 2015-07-23 (×8): qty 3

## 2015-07-23 MED ORDER — ENOXAPARIN SODIUM 40 MG/0.4ML ~~LOC~~ SOLN: 40.0000 mg | SUBCUTANEOUS | Status: DC

## 2015-07-23 MED ORDER — DEXAMETHASONE SODIUM PHOSPHATE 10 MG/ML IJ SOLN
10.0000 mg | Freq: Three times a day (TID) | INTRAMUSCULAR | Status: DC
  Administered 2015-07-23 – 2015-07-24 (×4): 10 mg via INTRAVENOUS
  Filled 2015-07-23 (×6): qty 1
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
## 2015-07-23 NOTE — ED Notes (Signed)
Called ENT- spoke with [facility] Assistant for a STAT consultation. -Dr.Bates is in surgery and will call back to [place].

## 2015-07-23 NOTE — ED Notes (Signed)
Upon walking into pt room, pt has large jar of salted peanuts on his lap eating peanuts and states "I just could not wait." Resident aware. Pt stating he would like to leave AMA since we are not letting him eat. Resident recommends letting pt eat at this time.

## 2015-07-23 NOTE — ED Notes (Addendum)
Pt reporting SOB that started yesterday and got worse today.  Pt reporting cough for a couple days. Pt reporting history of anxiety, also wants help with that. Reports "I'm going through a lot".  Denies SI/HI

## 2015-07-23 NOTE — ED Notes (Signed)
Pt requesting to eat, pt notified that resident must speak with ENT to be 100% positive that no procedures will be completed today. Pt growing irritated and requesting to leave AMA. Resident notified and at bedside to speak with pt.

## 2015-07-23 NOTE — Procedures (Signed)
Preop diagnosis: Stridor Postop diagnosis: same Procedure: Transnasal fiberoptic laryngoscopy Surgeon: Redmond Baseman Anesth: Topical with 4% lidocaine Comp: None Findings: Pharynx and larynx exam is free of mass, ulceration, or much swelling.  Neither vocal fold abducts well with the left side immobile and the folds in paramedian position.  Mass effect in the left supraglottic larynx is not seen.  The pyriform sinuses appear symmetric but deeper view of the pyriform sinuses is not possible.  Subglottic airway is not well-seen due to the narrow glottic airway.  There appears to be some undersurface swelling of the vocal folds. Description: After discussing risks, benefits, and alternatives, the patient was placed in a seated position and the right nasal passage was sprayed with topical anesthetic.  The fiberoptic scope was passed through the right nasal passage to view the pharynx and larynx.  After completion, the scope was removed and he was returned to nursing care in stable condition.

## 2015-07-23 NOTE — ED Notes (Signed)
Pt requested a urinal and when I walked into the room I smelled peanuts and noticed a jar of what appeared to be peanuts on the pt's chair by his bag.

## 2015-07-23 NOTE — ED Notes (Signed)
Patient handed an urinal to use

## 2015-07-23 NOTE — ED Notes (Signed)
Report called to [place] at [facility] ED, [facility] called for transport.

## 2015-07-23 NOTE — ED Notes (Signed)
Pt requesting to leave. Very agitated that we are not going to order him a meal tray. Resident is aware and MD Belfi at bedside to discuss risks with pt.

## 2015-07-23 NOTE — ED Notes (Signed)
Pt asking to eat. Explained to pt the risks, again. Pt states "well what if I eat some food and yall don't know it." Explained to pt that we cannot stop him from doing this but it is not recommended as he is high risk for aspiration.

## 2015-07-23 NOTE — ED Notes (Signed)
Patient belongings placed in pt belonging bag. Consist of shoes, socks, phone charger, phone,wallet.

## 2015-07-23 NOTE — Progress Notes (Signed)
Patient ID: Troy Robbins, male   DOB: 01-Apr-1971, 44 y.o.   MRN: [id number] He is feeling a bit better with less discomfort.  Still with inspiratory stridor with any deep breathing.  Appears very comfortable. He still wishes to avoid a tracheostomy at this time.  Will go ahead with observation in step-down unit but will order full liquid diet.  He has been eating his own peanuts in the ER and I asked him to stick with our orders/recommendations.
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
## 2015-07-23 NOTE — ED Notes (Signed)
ENT at bedside

## 2015-07-23 NOTE — ED Notes (Signed)
[facility] here for transport, report given and transfer paper given. Patient alert and airway patent.

## 2015-07-23 NOTE — ED Notes (Signed)
Pt continuously asking to eat, explained to pt that he is high risk for aspiration and ENT recommends pt remaining NPO. Resident at bedside explaining to pt the risks of eating. Pt verbalizes understanding.

## 2015-07-23 NOTE — ED Notes (Signed)
Pt arrived to [facility]. Pt airway is patent, no respiratory distress noted at this time. Pt is a/o x4. Has difficulty speaking at times. MD Belfi at bedside as well as Resident. VSS. O2 sats @ 100%.

## 2015-07-24 MED ORDER — PREDNISONE 20 MG PO TABS: 60.0000 mg | ORAL_TABLET | Freq: Every day | ORAL | Status: DC

## 2015-07-24 MED ORDER — OMEPRAZOLE 40 MG PO CPDR: 40.0000 mg | DELAYED_RELEASE_CAPSULE | Freq: Every day | ORAL | Status: DC

## 2015-07-24 MED ORDER — AMOXICILLIN-POT CLAVULANATE 875-125 MG PO TABS: 1.0000 | ORAL_TABLET | Freq: Two times a day (BID) | ORAL | Status: DC
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
## 2015-07-24 NOTE — Progress Notes (Signed)
Discharge information discussed with patient.  This included prescriptions, MD f/u next week, and affirmation by patient to adhere to medication regimen.  Patient stated to this nurse that while he understood the benefits of a tracheostomy as outlined by Dr. Redmond Baseman, it was not his desire to have this form of treatment.  Attempt made to call CSW without success.  I had communicated needs with Case Management and obtained the taxi voucher which will be used for patient to get to his friend's house.  Patient ambulated to exit escorted by nurse technician to wait for taxi.  Estimated price of trip noted to be approximately $48.00.
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
## 2016-03-19 ENCOUNTER — Emergency Department (HOSPITAL_COMMUNITY)
Admission: EM | Admit: 2016-03-19 | Discharge: 2016-03-19 | Disposition: A | Discharge status: Home / Self Care | Payer: MEDICAID | Attending: Emergency Medicine | Admitting: Emergency Medicine

## 2016-03-19 ENCOUNTER — Encounter (HOSPITAL_COMMUNITY): Payer: Self-pay | Admitting: Emergency Medicine

## 2016-03-19 ENCOUNTER — Emergency Department (HOSPITAL_COMMUNITY): Payer: MEDICAID

## 2016-03-19 DIAGNOSIS — R059 Cough, unspecified: Secondary | ICD-10-CM

## 2016-03-19 DIAGNOSIS — R05 Cough: Secondary | ICD-10-CM

## 2016-03-19 DIAGNOSIS — F17210 Nicotine dependence, cigarettes, uncomplicated: Secondary | ICD-10-CM | POA: Insufficient documentation

## 2016-03-19 MED ORDER — ALBUTEROL SULFATE HFA 108 (90 BASE) MCG/ACT IN AERS
2.0000 | INHALATION_SPRAY | Freq: Once | RESPIRATORY_TRACT | Status: AC
  Administered 2016-03-19: 2 via RESPIRATORY_TRACT
  Filled 2016-03-19: qty 6.7

## 2016-03-19 NOTE — Discharge Instructions (Signed)
Your x-ray shows no obvious pneumonia or lung mass, but does exhibit changes of a chronic smoker. Ibuprofen for pain. Use your inhaler as needed. Try to stop smoking.

## 2016-03-19 NOTE — ED Provider Notes (Signed)
CSN: [id number]     Arrival date & time 03/19/16  1153 History   First MD Initiated Contact with Patient 03/19/16 1229     Chief Complaint  Patient presents with  . Cough     (Consider location/radiation/quality/duration/timing/severity/associated sxs/prior Treatment) HPI...Marland KitchenMarland KitchenRight chest wall pain for 1 week with associated cough and sputum production. He is long-term smoker. No fever, chills, rusty sputum. Severity symptoms is mild. Symptoms are worse with deep breath.  Past Medical History  Diagnosis Date  . Seasonal allergies   . Acid reflux    Past Surgical History  Procedure Laterality Date  . Video bronchoscopy N/A 05/27/2015    Procedure: BEDSIDE VIDEO BRONCHOSCOPY;  Surgeon: Melrose Nakayama, MD;  Location: [place];  Service: Thoracic;  Laterality: N/A;  . No past surgeries     No family history on file. Social History  Substance Use Topics  . Smoking status: Current Every Day Smoker -- 1.00 packs/day    Types: Cigarettes  . Smokeless tobacco: Never Used  . Alcohol Use: Yes     Comment: Occ    Review of Systems  All other systems reviewed and are negative.     Allergies  Review of patient's allergies indicates no known allergies.  Home Medications   Prior to Admission medications   Medication Sig Start Date End Date Taking? Authorizing Provider  acetaminophen (TYLENOL) 325 MG tablet Take 2 tablets (650 mg total) by mouth every 6 (six) hours as needed for mild pain. Patient not taking: Reported on 07/23/2015 06/02/15   Nat Christen, PA-C  amoxicillin-clavulanate (AUGMENTIN) 875-125 MG per tablet Take 1 tablet by mouth 2 (two) times daily. 07/24/15   Melida Quitter, MD  benzonatate (TESSALON) 100 MG capsule Take 2 capsules (200 mg total) by mouth 3 (three) times daily as needed. 06/20/15   Evalee Jefferson, PA-C  docusate sodium (COLACE) 100 MG capsule Take 1 capsule (100 mg total) by mouth 2 (two) times daily. Patient not taking: Reported on 07/23/2015 06/02/15   Nat Christen, PA-C  folic acid (FOLVITE) 1 MG tablet Take 1 tablet (1 mg total) by mouth daily. 06/02/15   Nat Christen, PA-C  methocarbamol (ROBAXIN) 500 MG tablet Take 2 tablets (1,000 mg total) by mouth every 8 (eight) hours as needed for muscle spasms (or pain). Patient not taking: Reported on 07/23/2015 06/02/15   Nat Christen, PA-C  Multiple Vitamin (MULTIVITAMIN WITH MINERALS) TABS tablet Take 1 tablet by mouth daily. Patient not taking: Reported on 07/23/2015 06/02/15   Nat Christen, PA-C  omeprazole (PRILOSEC) 40 MG capsule Take 1 capsule (40 mg total) by mouth daily. 07/24/15   Melida Quitter, MD  oxyCODONE (OXY IR/ROXICODONE) 5 MG immediate release tablet Take 1-3 tablets (5-15 mg total) by mouth every 6 (six) hours as needed for moderate pain, severe pain or breakthrough pain. 06/02/15   Nat Christen, PA-C  polyethylene glycol (MIRALAX / GLYCOLAX) packet Take 17 g by mouth daily. 06/02/15   Nat Christen, PA-C  predniSONE (DELTASONE) 20 MG tablet Take 3 tablets (60 mg total) by mouth daily with breakfast. Take 3 tablets by mouth once daily for 7 days, then 2 tablets once daily for 3 days, then 1 tablet once daily for 3 days. 07/24/15   Melida Quitter, MD  thiamine 100 MG tablet Take 1 tablet (100 mg total) by mouth daily. 06/02/15   Nat Christen, PA-C   BP 124/76 mmHg  Pulse 74  Temp(Src) 98.8 F (37.1  C) (Oral)  Resp 16  Ht 5\' 9"  (1.753 m)  Wt 145 lb (65.772 kg)  BMI 21.40 kg/m2  SpO2 98% Physical Exam  Constitutional: He is oriented to person, place, and time. He appears well-developed and well-nourished.  HENT:  Head: Normocephalic and atraumatic.  Eyes: Conjunctivae and EOM are normal. Pupils are equal, round, and reactive to light.  Neck: Normal range of motion. Neck supple.  Cardiovascular: Normal rate and regular rhythm.   Pulmonary/Chest: Effort normal and breath sounds normal.  Abdominal: Soft. Bowel sounds are normal.  Musculoskeletal: Normal range of motion.  Neurological: He is  alert and oriented to person, place, and time.  Skin: Skin is warm and dry.  Psychiatric: He has a normal mood and affect. His behavior is normal.  Nursing note and vitals reviewed.   ED Course  Procedures (including critical care time) Labs Review Labs Reviewed - No data to display  Imaging Review Dg Chest 2 View  03/19/2016  CLINICAL DATA:  45 year old male with cough and right upper rib pain. Thick sputum production. EXAM: CHEST  2 VIEW COMPARISON:  Chest x-ray 07/23/2015. FINDINGS: Lungs appear hyperexpanded with flattening of the hemidiaphragms, increased retrosternal air space and pruning of the pulmonary vasculature in the periphery, suggestive of underlying COPD. No consolidative airspace disease. No pleural effusions. No pneumothorax. No pulmonary nodule or mass noted. Pulmonary vasculature and the cardiomediastinal silhouette are within normal limits. IMPRESSION: 1. The appearance of the lungs suggests underlying COPD. 2. No radiographic evidence of acute cardiopulmonary disease. Electronically Signed   By: Vinnie Langton M.D.   On: 03/19/2016 13:06   I have personally reviewed and evaluated these images and lab results as part of my medical decision-making.   EKG Interpretation None      MDM   Final diagnoses:  Cough    Patient is in no acute distress. He is oxygenating well. Chest x-ray negative for pneumonia;  however, there is evidence of COPD. Rx albuterol inhaler, ibuprofen. Encouraged to stop smoking    Nat Christen, MD 03/19/16 1318

## 2016-03-19 NOTE — ED Notes (Signed)
Patient c/o cough with right upper rib pain. Per patient thick sputum. Per girlfriend patient has "smokers cough" but rib pain started x1 week ago. Patient states hurts to take deep breath. Denies any fevers.

## 2016-03-19 NOTE — ED Notes (Signed)
Pt left before dc paperwork given. Vocal about needing strong pain medicine.

## 2016-07-22 ENCOUNTER — Emergency Department (HOSPITAL_COMMUNITY)

## 2016-07-22 ENCOUNTER — Encounter (HOSPITAL_COMMUNITY): Payer: Self-pay | Admitting: Cardiology

## 2016-07-22 ENCOUNTER — Inpatient Hospital Stay (HOSPITAL_COMMUNITY)
Admission: EM | Admit: 2016-07-22 | Discharge: 2016-07-23 | DRG: 189 | Discharge status: Against Medical Advice | Attending: Internal Medicine | Admitting: Internal Medicine

## 2016-07-22 ENCOUNTER — Inpatient Hospital Stay (HOSPITAL_COMMUNITY)

## 2016-07-22 DIAGNOSIS — E876 Hypokalemia: Secondary | ICD-10-CM | POA: Diagnosis not present

## 2016-07-22 DIAGNOSIS — F17210 Nicotine dependence, cigarettes, uncomplicated: Secondary | ICD-10-CM | POA: Diagnosis present

## 2016-07-22 DIAGNOSIS — J189 Pneumonia, unspecified organism: Secondary | ICD-10-CM

## 2016-07-22 DIAGNOSIS — K219 Gastro-esophageal reflux disease without esophagitis: Secondary | ICD-10-CM | POA: Diagnosis present

## 2016-07-22 DIAGNOSIS — J9601 Acute respiratory failure with hypoxia: Secondary | ICD-10-CM | POA: Diagnosis present

## 2016-07-22 DIAGNOSIS — J9690 Respiratory failure, unspecified, unspecified whether with hypoxia or hypercapnia: Secondary | ICD-10-CM

## 2016-07-22 DIAGNOSIS — G9340 Encephalopathy, unspecified: Secondary | ICD-10-CM | POA: Diagnosis present

## 2016-07-22 DIAGNOSIS — Z91048 Other nonmedicinal substance allergy status: Secondary | ICD-10-CM

## 2016-07-22 DIAGNOSIS — J386 Stenosis of larynx: Secondary | ICD-10-CM | POA: Diagnosis present

## 2016-07-22 DIAGNOSIS — J988 Other specified respiratory disorders: Secondary | ICD-10-CM | POA: Diagnosis present

## 2016-07-22 DIAGNOSIS — J9600 Acute respiratory failure, unspecified whether with hypoxia or hypercapnia: Principal | ICD-10-CM | POA: Diagnosis present

## 2016-07-22 DIAGNOSIS — R001 Bradycardia, unspecified: Secondary | ICD-10-CM | POA: Diagnosis not present

## 2016-07-22 DIAGNOSIS — Z79899 Other long term (current) drug therapy: Secondary | ICD-10-CM

## 2016-07-22 DIAGNOSIS — I959 Hypotension, unspecified: Secondary | ICD-10-CM

## 2016-07-22 LAB — URINALYSIS, ROUTINE W REFLEX MICROSCOPIC
Bilirubin Urine: NEGATIVE
Glucose, UA: >1000 mg/dL — AB
KETONES UR: NEGATIVE mg/dL
LEUKOCYTES UA: NEGATIVE
NITRITE: NEGATIVE
PH: 5.5 (ref 5.0–8.0)
PROTEIN: 100 mg/dL — AB
Specific Gravity, Urine: >1.03 — ABNORMAL HIGH (ref 1.005–1.030)

## 2016-07-22 LAB — BLOOD GAS, ARTERIAL
ACID-BASE DEFICIT: 1.4 mmol/L (ref 0.0–2.0)
Bicarbonate: 22.4 mmol/L (ref 20.0–28.0)
FIO2: 100
MECHVT: 500 mL
O2 SAT: 99.1 %
PATIENT TEMPERATURE: 37
PCO2 ART: 54.9 mmHg — AB (ref 32.0–48.0)
PEEP: 5 cmH2O
PH ART: 7.272 — AB (ref 7.350–7.450)
PO2 ART: 176 mmHg — AB (ref 83.0–108.0)
RATE: 16 resp/min
TCO2: 16.7 mmol/L (ref 0–100)

## 2016-07-22 LAB — COMPREHENSIVE METABOLIC PANEL
ALBUMIN: 4.6 g/dL (ref 3.5–5.0)
ALK PHOS: 60 U/L (ref 38–126)
ALT: 44 U/L (ref 17–63)
ANION GAP: 6 (ref 5–15)
AST: 36 U/L (ref 15–41)
BILIRUBIN TOTAL: 0.3 mg/dL (ref 0.3–1.2)
BUN: 13 mg/dL (ref 6–20)
CALCIUM: 8.7 mg/dL — AB (ref 8.9–10.3)
CO2: 32 mmol/L (ref 22–32)
CREATININE: 0.69 mg/dL (ref 0.61–1.24)
Chloride: 105 mmol/L (ref 101–111)
GFR calc Af Amer: >60 mL/min (ref >60)
GFR calc non Af Amer: >60 mL/min (ref >60)
GLUCOSE: 304 mg/dL — AB (ref 65–99)
Potassium: 3.3 mmol/L — ABNORMAL LOW (ref 3.5–5.1)
SODIUM: 143 mmol/L (ref 135–145)
TOTAL PROTEIN: 7.1 g/dL (ref 6.5–8.1)

## 2016-07-22 LAB — CBC
HEMATOCRIT: 38.2 % — AB (ref 39.0–52.0)
HEMOGLOBIN: 12.2 g/dL — AB (ref 13.0–17.0)
MCH: 30.3 pg (ref 26.0–34.0)
MCHC: 31.9 g/dL (ref 30.0–36.0)
MCV: 95 fL (ref 78.0–100.0)
Platelets: 140 10*3/uL — ABNORMAL LOW (ref 150–400)
RBC: 4.02 MIL/uL — ABNORMAL LOW (ref 4.22–5.81)
RDW: 13 % (ref 11.5–15.5)
WBC: 11.2 10*3/uL — ABNORMAL HIGH (ref 4.0–10.5)

## 2016-07-22 LAB — TRIGLYCERIDES: TRIGLYCERIDES: 35 mg/dL (ref <150)

## 2016-07-22 LAB — I-STAT TROPONIN, ED: Troponin i, poc: 0 ng/mL (ref 0.00–0.08)

## 2016-07-22 LAB — CBC WITH DIFFERENTIAL/PLATELET
BASOS ABS: 0 10*3/uL (ref 0.0–0.1)
Basophils Relative: 0 %
Eosinophils Absolute: 0.5 10*3/uL (ref 0.0–0.7)
Eosinophils Relative: 3 %
HEMATOCRIT: 41.1 % (ref 39.0–52.0)
HEMOGLOBIN: 13.4 g/dL (ref 13.0–17.0)
LYMPHS PCT: 41 %
Lymphs Abs: 7.3 10*3/uL — ABNORMAL HIGH (ref 0.7–4.0)
MCH: 32 pg (ref 26.0–34.0)
MCHC: 32.6 g/dL (ref 30.0–36.0)
MCV: 98.1 fL (ref 78.0–100.0)
MONOS PCT: 10 %
Monocytes Absolute: 1.8 10*3/uL — ABNORMAL HIGH (ref 0.1–1.0)
NEUTROS ABS: 8.1 10*3/uL — AB (ref 1.7–7.7)
NEUTROS PCT: 46 %
Platelets: 180 10*3/uL (ref 150–400)
RBC: 4.19 MIL/uL — ABNORMAL LOW (ref 4.22–5.81)
RDW: 13.2 % (ref 11.5–15.5)
WBC: 17.7 10*3/uL — ABNORMAL HIGH (ref 4.0–10.5)

## 2016-07-22 LAB — RAPID URINE DRUG SCREEN, HOSP PERFORMED
AMPHETAMINES: NOT DETECTED
BENZODIAZEPINES: NOT DETECTED
Barbiturates: NOT DETECTED
COCAINE: NOT DETECTED
Opiates: NOT DETECTED
Tetrahydrocannabinol: NOT DETECTED

## 2016-07-22 LAB — LACTIC ACID, PLASMA
LACTIC ACID, VENOUS: 2.3 mmol/L — AB (ref 0.5–1.9)
Lactic Acid, Venous: 1.6 mmol/L (ref 0.5–1.9)

## 2016-07-22 LAB — GLUCOSE, CAPILLARY: GLUCOSE-CAPILLARY: 111 mg/dL — AB (ref 65–99)

## 2016-07-22 LAB — I-STAT CG4 LACTIC ACID, ED: Lactic Acid, Venous: 3.4 mmol/L (ref 0.5–1.9)

## 2016-07-22 LAB — CREATININE, SERUM
CREATININE: 0.73 mg/dL (ref 0.61–1.24)
GFR calc Af Amer: >60 mL/min (ref >60)
GFR calc non Af Amer: >60 mL/min (ref >60)

## 2016-07-22 LAB — URINE MICROSCOPIC-ADD ON

## 2016-07-22 LAB — ETHANOL: Alcohol, Ethyl (B): <5 mg/dL (ref <5)

## 2016-07-22 MED ORDER — PROPOFOL 1000 MG/100ML IV EMUL
INTRAVENOUS | Status: AC
  Filled 2016-07-22: qty 100

## 2016-07-22 MED ORDER — SODIUM CHLORIDE 0.9 % IV BOLUS (SEPSIS)
1000.0000 mL | Freq: Once | INTRAVENOUS | Status: AC
  Administered 2016-07-22: 1000 mL via INTRAVENOUS

## 2016-07-22 MED ORDER — POTASSIUM CHLORIDE 20 MEQ/15ML (10%) PO SOLN
40.0000 meq | Freq: Once | ORAL | Status: AC
  Administered 2016-07-22: 40 meq
  Filled 2016-07-22 (×2): qty 30

## 2016-07-22 MED ORDER — SODIUM CHLORIDE 0.9 % IV SOLN
250.0000 mL | INTRAVENOUS | Status: DC | PRN
Start: 2016-07-22 — End: 2016-07-23

## 2016-07-22 MED ORDER — MIDAZOLAM HCL 2 MG/2ML IJ SOLN
4.0000 mg | Freq: Once | INTRAMUSCULAR | Status: AC
  Administered 2016-07-22: 4 mg via INTRAVENOUS
  Filled 2016-07-22: qty 4

## 2016-07-22 MED ORDER — PIPERACILLIN-TAZOBACTAM 3.375 G IVPB 30 MIN
3.3750 g | Freq: Once | INTRAVENOUS | Status: AC
  Administered 2016-07-22: 3.375 g via INTRAVENOUS
  Filled 2016-07-22: qty 50

## 2016-07-22 MED ORDER — SODIUM CHLORIDE 0.9 % IV SOLN: INTRAVENOUS | Status: DC

## 2016-07-22 MED ORDER — SODIUM CHLORIDE 0.9 % IV BOLUS (SEPSIS): 250.0000 mL | Freq: Once | INTRAVENOUS | Status: DC

## 2016-07-22 MED ORDER — ONDANSETRON HCL 4 MG/2ML IJ SOLN: 4.0000 mg | Freq: Four times a day (QID) | INTRAMUSCULAR | Status: DC | PRN

## 2016-07-22 MED ORDER — DEXTROSE-NACL 5-0.45 % IV SOLN
INTRAVENOUS | Status: DC
  Administered 2016-07-22: 23:00:00 via INTRAVENOUS

## 2016-07-22 MED ORDER — PIPERACILLIN-TAZOBACTAM 3.375 G IVPB
3.3750 g | Freq: Three times a day (TID) | INTRAVENOUS | Status: DC
  Administered 2016-07-22 – 2016-07-23 (×3): 3.375 g via INTRAVENOUS
  Filled 2016-07-22 (×5): qty 50

## 2016-07-22 MED ORDER — PANTOPRAZOLE SODIUM 40 MG PO TBEC: 40.0000 mg | DELAYED_RELEASE_TABLET | Freq: Every day | ORAL | Status: DC

## 2016-07-22 MED ORDER — SODIUM CHLORIDE 0.9 % IV SOLN
25.0000 ug/h | INTRAVENOUS | Status: DC
  Administered 2016-07-22: 25 ug/h via INTRAVENOUS
  Filled 2016-07-22: qty 50

## 2016-07-22 MED ORDER — VANCOMYCIN HCL IN DEXTROSE 1-5 GM/200ML-% IV SOLN
1000.0000 mg | Freq: Three times a day (TID) | INTRAVENOUS | Status: DC
  Administered 2016-07-22 – 2016-07-23 (×3): 1000 mg via INTRAVENOUS
  Filled 2016-07-22 (×4): qty 200

## 2016-07-22 MED ORDER — SODIUM CHLORIDE 0.9 % IV SOLN
INTRAVENOUS | Status: DC
  Administered 2016-07-22 (×2): via INTRAVENOUS

## 2016-07-22 MED ORDER — LORAZEPAM 2 MG/ML IJ SOLN
1.0000 mg | Freq: Once | INTRAMUSCULAR | Status: AC
  Administered 2016-07-22: 1 mg via INTRAVENOUS
  Filled 2016-07-22: qty 1

## 2016-07-22 MED ORDER — ACETAMINOPHEN 325 MG PO TABS: 650.0000 mg | ORAL_TABLET | ORAL | Status: DC | PRN

## 2016-07-22 MED ORDER — MIDAZOLAM HCL 2 MG/2ML IJ SOLN
2.0000 mg | Freq: Once | INTRAMUSCULAR | Status: AC
  Administered 2016-07-22: 2 mg via INTRAVENOUS
  Filled 2016-07-22: qty 2

## 2016-07-22 MED ORDER — PROPOFOL 1000 MG/100ML IV EMUL
INTRAVENOUS | Status: AC
  Administered 2016-07-23: 25 ug/kg/min via INTRAVENOUS
  Filled 2016-07-22: qty 100

## 2016-07-22 MED ORDER — VANCOMYCIN HCL IN DEXTROSE 1-5 GM/200ML-% IV SOLN
1000.0000 mg | Freq: Once | INTRAVENOUS | Status: AC
  Administered 2016-07-22: 1000 mg via INTRAVENOUS
  Filled 2016-07-22: qty 200

## 2016-07-22 MED ORDER — ROCURONIUM BROMIDE 50 MG/5ML IV SOLN
100.0000 mg | Freq: Once | INTRAVENOUS | Status: AC
  Administered 2016-07-22: 100 mg via INTRAVENOUS

## 2016-07-22 MED ORDER — ETOMIDATE 2 MG/ML IV SOLN
20.0000 mg | Freq: Once | INTRAVENOUS | Status: AC
  Administered 2016-07-22: 20 mg via INTRAVENOUS

## 2016-07-22 MED ORDER — PROPOFOL 1000 MG/100ML IV EMUL
0.0000 ug/kg/min | INTRAVENOUS | Status: DC
  Administered 2016-07-23: 25 ug/kg/min via INTRAVENOUS
  Administered 2016-07-23: 10 ug/kg/min via INTRAVENOUS
  Filled 2016-07-22 (×2): qty 100

## 2016-07-22 MED ORDER — FENTANYL CITRATE (PF) 100 MCG/2ML IJ SOLN
50.0000 ug | Freq: Once | INTRAMUSCULAR | Status: AC
  Administered 2016-07-22: 50 ug via INTRAVENOUS
  Filled 2016-07-22: qty 2

## 2016-07-22 MED ORDER — IOPAMIDOL (ISOVUE-300) INJECTION 61%
75.0000 mL | Freq: Once | INTRAVENOUS | Status: AC | PRN
  Administered 2016-07-22: 75 mL via INTRAVENOUS

## 2016-07-22 MED ORDER — ENOXAPARIN SODIUM 40 MG/0.4ML ~~LOC~~ SOLN
40.0000 mg | Freq: Every day | SUBCUTANEOUS | Status: DC
  Administered 2016-07-23: 40 mg via SUBCUTANEOUS
  Filled 2016-07-22: qty 0.4

## 2016-07-22 MED ORDER — NALOXONE HCL 2 MG/2ML IJ SOSY
2.0000 mg | PREFILLED_SYRINGE | Freq: Once | INTRAMUSCULAR | Status: AC
  Administered 2016-07-22: 2 mg via INTRAVENOUS

## 2016-07-22 MED ORDER — METHYLPREDNISOLONE SODIUM SUCC 125 MG IJ SOLR
125.0000 mg | Freq: Once | INTRAMUSCULAR | Status: AC
  Administered 2016-07-22: 125 mg via INTRAVENOUS
  Filled 2016-07-22: qty 2

## 2016-07-22 MED ORDER — PROPOFOL 1000 MG/100ML IV EMUL
5.0000 ug/kg/min | Freq: Once | INTRAVENOUS | Status: DC
  Administered 2016-07-22: 5 ug/kg/min via INTRAVENOUS
  Administered 2016-07-22: 10:00:00 via INTRAVENOUS

## 2016-07-22 MED ORDER — FENTANYL BOLUS VIA INFUSION
50.0000 ug | INTRAVENOUS | Status: DC | PRN
  Filled 2016-07-22: qty 50

## 2016-07-22 NOTE — Progress Notes (Signed)
Pharmacy Antibiotic Note  Troy Robbins is a 45 y.o. male admitted on 07/22/2016 with sepsis.  Pharmacy has been consulted for vancomycin and zosyn dosing. Vancomycin 1 gm and zosyn 3.375 gm ordered in the ED  Plan: Cont vanc 1gm IV q8 hours Cont zosyn 3.375 gm IV q8 hours F/u renal function, cultures and clinical course     No data recorded.   Recent Labs Lab 07/22/16 0830 07/22/16 0844  WBC 17.7*  --   CREATININE 0.69  --   LATICACIDVEN  --  3.40*    CrCl cannot be calculated (Unknown ideal weight.).    No Known Allergies  Antimicrobials this admission: 9/16 vanc >>  9/16 zosyn >>   Thank you for allowing pharmacy to be a part of this patient's care.  Beverlee Nims 07/22/2016 9:39 AM

## 2016-07-22 NOTE — ED Notes (Addendum)
Unable to pass bougie .  Continue bagging pt.

## 2016-07-22 NOTE — ED Notes (Addendum)
Intubated with 6.5 tube.  23 at the lip.  Color change on co detector

## 2016-07-22 NOTE — ED Notes (Signed)
Pt continues unresponsive at this time.  Respiratory distress.  Nonrebreather on sats 96%.  Preparing for intubation.

## 2016-07-22 NOTE — ED Notes (Signed)
EDP is aware of pt's BP and decrease in propofol

## 2016-07-22 NOTE — ED Notes (Signed)
Attempted 6.5 tube.  Bagging pt.  sats remain 91%.

## 2016-07-22 NOTE — ED Notes (Signed)
CRITICAL VALUE ALERT  Critical value received:  Ph 7.272, PCO2 54.9, PO2 176, Bicarb 22.4  Date of notification:  07/22/16  Time of notification:  1129  Critical value read back:Yes.    Nurse who received alert:  B. Olena Heckle, RN  MD notified (1st page):  Zackowski  Time of first page:  1129

## 2016-07-22 NOTE — ED Notes (Signed)
Bair Hugget placed on pt

## 2016-07-22 NOTE — ED Notes (Signed)
Unable to intubate with 7.5 tube.  Bagging pt at this time.

## 2016-07-22 NOTE — ED Notes (Signed)
Report given to corey with [facility]

## 2016-07-22 NOTE — ED Notes (Signed)
Attempted size 7 tube.  Bagging pt.

## 2016-07-22 NOTE — ED Notes (Signed)
Continue bagging pt.  sats 95%.  Pt remains unresponsive.

## 2016-07-22 NOTE — ED Notes (Signed)
Dr Rogene Houston notified of BP . Will be back in to reassess. Lifted head up off pillow and diprivan increased to 30

## 2016-07-22 NOTE — Progress Notes (Signed)
Pt received in from [facility] via [facility], propofol infusing @35mcg , vss, transfer of care done.

## 2016-07-22 NOTE — ED Notes (Signed)
Antibiotics paused . Lab called to obtain blood cultures

## 2016-07-22 NOTE — ED Provider Notes (Signed)
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 bolus 1,000 mL (1,000 mLs Intravenous New Bag/Given 07/22/16 1144)     Initial Impression / Assessment and Plan / ED Course  I have reviewed the triage vital signs and the nursing notes.  Pertinent labs & imaging results that were available during my care of the patient were reviewed by me and considered in my medical decision making (see chart for details).  Clinical  Course   Review of patient's records showed that in September 2016 patient was admitted with stridor. Was followed by the ear nose and throat service at [facility]. CT scan at that time raise concerns for alaryngeal mass. ENT recommended tracheostomy however patient refused. Then she was treated just with steroids slowly got better and discharged. 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 Using fiberoptic laryngoscope they were unable to pass it through the cords. Never got a full assessment below the vocal cords.  This probably explains the difficulty airway for the patient when he presented today. CT  scan of the area just shows edema in the soft tissue portion of the neck. No specific mass. CT of the chest raise concerns for aspiration pneumonia or bilateral pneumonia. Based on the patient's presentation most likely aspiration pneumonia.   Ran into difficulty with hypotension following the starting of propofol drip to keep him sedated. Backing off on the propofol and providing fluids of blood pressure slowly improved. But still is in the upper 80s to low 90s. Patient's not tachycardic. Oxygen saturations are good. Patient took a long time to wake up did not require any sedation at all the right before he went to CT scan he was start in the buccal on the tube so the propofol was started. That's when the hypotension problems started. Patient seems to be responding slowly to fluid resuscitation for the hypotension. Patient's had a total of 5 L of fluid. Repeat lactic acid is pending. Labs otherwise without any significant abnormalities. Renal function was normal. Patient also was hypothermic. Started on [facility].  Patient due to his presentation and the possible of aspiration pneumonia or pneumonia was started on septic protocol antibiotics. Blood cultures were ordered. Patient was fluid resuscitated.  Discussed with pulmonary critical care [facility] been accepted to the ICU there currently no beds. Patient awaiting bed placement and transportation.   Final Clinical Impressions(s) / ED Diagnoses   Final diagnoses:  Airway obstruction  Acute respiratory failure with hypoxia (HCC)  Hypotension, unspecified hypotension type  CAP (community acquired pneumonia)    New Prescriptions New Prescriptions   No medications on file     Fredia Sorrow, MD 07/22/16 1352

## 2016-07-22 NOTE — ED Notes (Signed)
Etomidate and roc given .

## 2016-07-22 NOTE — ED Notes (Signed)
Pt resp up to 36. Dr Minette Brine notified. Ativan 1 mg IV ordered

## 2016-07-22 NOTE — ED Notes (Signed)
Blood cultures obtained X 2 Zosyn and vancomycin restarted

## 2016-07-22 NOTE — ED Notes (Addendum)
Transported to CT  VS stable. Accompanied by Rt

## 2016-07-22 NOTE — ED Triage Notes (Signed)
Pt c/o SOB that started yesterday.  This morning collapsed while sitting on a bench.  Pt was unresponsive when EMS arrived.  Initial sats 70%.  NSR .  CBG 285.

## 2016-07-22 NOTE — ED Notes (Signed)
OG placed and auscultated placement.

## 2016-07-22 NOTE — Progress Notes (Signed)
eLink Physician-Brief Progress Note Patient Name: Troy Robbins DOB: 12/12/1970 MRN: [id number]   Date of Service  07/22/2016  HPI/Events of Note  Reviewed course at [facility]  eICU Interventions  Vent/ sedation orders given Goal RASS-3, difficult airway noted     Intervention Category Evaluation Type: New Patient Evaluation  Jess Toney V. 07/22/2016, 10:10 PM

## 2016-07-22 NOTE — ED Notes (Signed)
Pt returned from CT °

## 2016-07-22 NOTE — ED Notes (Addendum)
CRITICAL VALUE ALERT  Critical value received:  Lactic acid 2.3  Date of notification: 07/22/16 Time of notification:  [id number]  Critical value read back:Yes.    Nurse who received alert:  Dellis Anes MD notified (1st page):  Dr. Sabra Heck

## 2016-07-22 NOTE — ED Notes (Signed)
Verbal order to give 50 bolus of propofol x 1 and to double dose of of current dose of propofol due to pt waking up- eyes wide open, to prevent pt from pulling ET out.  Reported that pt was a very difficult intubation

## 2016-07-22 NOTE — ED Notes (Signed)
EDP at bedside aware of BP

## 2016-07-22 NOTE — ED Notes (Signed)
Attempted to call report, nurse unable and stated that she would call back.  Number given to call back.

## 2016-07-23 ENCOUNTER — Inpatient Hospital Stay (HOSPITAL_COMMUNITY)

## 2016-07-23 ENCOUNTER — Inpatient Hospital Stay (HOSPITAL_COMMUNITY): Admitting: Anesthesiology

## 2016-07-23 ENCOUNTER — Encounter (HOSPITAL_COMMUNITY)
Admission: EM | Discharge status: Against Medical Advice | Payer: Self-pay | Source: Home / Self Care | Attending: Internal Medicine

## 2016-07-23 DIAGNOSIS — J988 Other specified respiratory disorders: Secondary | ICD-10-CM | POA: Diagnosis not present

## 2016-07-23 DIAGNOSIS — I959 Hypotension, unspecified: Secondary | ICD-10-CM

## 2016-07-23 DIAGNOSIS — J9601 Acute respiratory failure with hypoxia: Secondary | ICD-10-CM | POA: Diagnosis not present

## 2016-07-23 DIAGNOSIS — J189 Pneumonia, unspecified organism: Secondary | ICD-10-CM | POA: Diagnosis not present

## 2016-07-23 LAB — CBC WITH DIFFERENTIAL/PLATELET
Basophils Absolute: 0 10*3/uL (ref 0.0–0.1)
Basophils Relative: 0 %
EOS PCT: 0 %
Eosinophils Absolute: 0 10*3/uL (ref 0.0–0.7)
HEMATOCRIT: 37 % — AB (ref 39.0–52.0)
Hemoglobin: 12 g/dL — ABNORMAL LOW (ref 13.0–17.0)
LYMPHS ABS: 1.2 10*3/uL (ref 0.7–4.0)
LYMPHS PCT: 12 %
MCH: 30.5 pg (ref 26.0–34.0)
MCHC: 32.4 g/dL (ref 30.0–36.0)
MCV: 94.1 fL (ref 78.0–100.0)
Monocytes Absolute: 0.3 10*3/uL (ref 0.1–1.0)
Monocytes Relative: 4 %
NEUTROS ABS: 8.3 10*3/uL — AB (ref 1.7–7.7)
Neutrophils Relative %: 84 %
PLATELETS: 134 10*3/uL — AB (ref 150–400)
RBC: 3.93 MIL/uL — AB (ref 4.22–5.81)
RDW: 13 % (ref 11.5–15.5)
WBC: 9.8 10*3/uL (ref 4.0–10.5)

## 2016-07-23 LAB — RENAL FUNCTION PANEL
ANION GAP: 8 (ref 5–15)
Albumin: 3.1 g/dL — ABNORMAL LOW (ref 3.5–5.0)
BUN: 6 mg/dL (ref 6–20)
CHLORIDE: 109 mmol/L (ref 101–111)
CO2: 23 mmol/L (ref 22–32)
Calcium: 8.5 mg/dL — ABNORMAL LOW (ref 8.9–10.3)
Creatinine, Ser: 0.59 mg/dL — ABNORMAL LOW (ref 0.61–1.24)
GFR calc Af Amer: >60 mL/min (ref >60)
GLUCOSE: 123 mg/dL — AB (ref 65–99)
POTASSIUM: 3.4 mmol/L — AB (ref 3.5–5.1)
Phosphorus: 2.5 mg/dL (ref 2.5–4.6)
Sodium: 140 mmol/L (ref 135–145)

## 2016-07-23 LAB — GLUCOSE, CAPILLARY
GLUCOSE-CAPILLARY: 101 mg/dL — AB (ref 65–99)
Glucose-Capillary: 125 mg/dL — ABNORMAL HIGH (ref 65–99)
Glucose-Capillary: 126 mg/dL — ABNORMAL HIGH (ref 65–99)
Glucose-Capillary: 117 mg/dL — ABNORMAL HIGH (ref 65–99)

## 2016-07-23 LAB — BLOOD GAS, ARTERIAL
Acid-base deficit: 0.8 mmol/L (ref 0.0–2.0)
Bicarbonate: 22.6 mmol/L (ref 20.0–28.0)
DRAWN BY: 40415
FIO2: 60
O2 SAT: 99.6 %
PATIENT TEMPERATURE: 98.6
PEEP/CPAP: 5 cmH2O
PH ART: 7.455 — AB (ref 7.350–7.450)
RATE: 20 resp/min
VT: 550 mL
pCO2 arterial: 32.6 mmHg (ref 32.0–48.0)
pO2, Arterial: 228 mmHg — ABNORMAL HIGH (ref 83.0–108.0)

## 2016-07-23 LAB — MRSA PCR SCREENING: MRSA BY PCR: NEGATIVE

## 2016-07-23 LAB — PROTIME-INR
INR: 1.26
PROTHROMBIN TIME: 15.9 s — AB (ref 11.4–15.2)

## 2016-07-23 LAB — APTT: aPTT: 34 seconds (ref 24–36)

## 2016-07-23 LAB — HEMOGLOBIN A1C
Hgb A1c MFr Bld: 5.3 % (ref 4.8–5.6)
Mean Plasma Glucose: 105 mg/dL

## 2016-07-23 LAB — TRIGLYCERIDES: Triglycerides: 52 mg/dL (ref <150)

## 2016-07-23 SURGERY — CREATION, TRACHEOSTOMY
Anesthesia: General

## 2016-07-23 MED ORDER — METHYLPREDNISOLONE SODIUM SUCC 125 MG IJ SOLR
60.0000 mg | Freq: Four times a day (QID) | INTRAMUSCULAR | Status: DC
  Administered 2016-07-23 (×3): 60 mg via INTRAVENOUS
  Filled 2016-07-23: qty 0.96
  Filled 2016-07-23 (×2): qty 2

## 2016-07-23 MED ORDER — POTASSIUM CHLORIDE 20 MEQ/15ML (10%) PO SOLN
20.0000 meq | ORAL | Status: AC
  Administered 2016-07-23 (×2): 20 meq
  Filled 2016-07-23 (×2): qty 15

## 2016-07-23 MED ORDER — EPINEPHRINE HCL 1 MG/ML IJ SOLN
INTRAMUSCULAR | Status: AC
  Filled 2016-07-23: qty 1

## 2016-07-23 MED ORDER — OXYMETAZOLINE HCL 0.05 % NA SOLN
NASAL | Status: AC
Start: 2016-07-23 — End: 2016-07-23
  Filled 2016-07-23: qty 15

## 2016-07-23 MED ORDER — INSULIN ASPART 100 UNIT/ML ~~LOC~~ SOLN
0.0000 [IU] | SUBCUTANEOUS | Status: DC
  Administered 2016-07-23 (×2): 1 [IU] via SUBCUTANEOUS

## 2016-07-23 MED ORDER — LIDOCAINE-EPINEPHRINE 1 %-1:100000 IJ SOLN
INTRAMUSCULAR | Status: AC
  Filled 2016-07-23: qty 1

## 2016-07-23 MED ORDER — MIDAZOLAM HCL 2 MG/2ML IJ SOLN
1.0000 mg | INTRAMUSCULAR | Status: DC | PRN
  Administered 2016-07-23 (×2): 1 mg via INTRAVENOUS
  Filled 2016-07-23: qty 2

## 2016-07-23 MED ORDER — PANTOPRAZOLE SODIUM 40 MG PO PACK
40.0000 mg | PACK | Freq: Every day | ORAL | Status: DC
  Administered 2016-07-23: 40 mg
  Filled 2016-07-23: qty 20

## 2016-07-23 MED ORDER — DEXAMETHASONE SODIUM PHOSPHATE 4 MG/ML IJ SOLN
6.0000 mg | Freq: Four times a day (QID) | INTRAMUSCULAR | Status: DC
Start: 2016-07-23 — End: 2016-07-23
  Filled 2016-07-23: qty 1.5

## 2016-07-23 NOTE — Progress Notes (Signed)
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

## 2016-07-23 NOTE — Progress Notes (Signed)
RT changing ETT holder. Patient grabs tube and self-extubated. MD and RN aware. Patient placed on 2lnc. Vital signs stable at this time. RT will continue to monitor.

## 2016-07-23 NOTE — Progress Notes (Signed)
[place] [employer] badge# [id number] at bedside, this rn notes left ankle cuffed to fob

## 2016-07-23 NOTE — Progress Notes (Addendum)
Pt is requesting to leave AMA. Pt was instructed that he is at extremely high risk of airway issues that could lead to pt demise. Pt states "I am fully aware of the risk that I might die". Pt answered all orientation questions correctly including place time and situation, and is adamant on leaving. Pt expresses "I need to go home and handle some business before I have this surgery". All belongings sent with patient. All IV's discontinued. MD made aware. Dr. Tamala Julian from [facility] spoke with pt directly and verified that the pt is fully aware of the risk associated with leaving AMA. AMA paperwork signed by pt and witnessed by Ellard Artis, RN and Enos Fling, RN.   BP: 112/93 HR:95 RR:19 O2 Sat: 100 (Room Air)

## 2016-07-23 NOTE — Anesthesia Preprocedure Evaluation (Signed)
Anesthesia Evaluation  Patient identified by MRN, date of birth, ID band Patient awake    Reviewed: Allergy & Precautions, NPO status , Patient's Chart, lab work & pertinent test results  Airway Mallampati: Trach  TM Distance: >3 FB Neck ROM: Full   Comment: VENTILATOR SETTINGS: Vent Mode: PRVC FiO2 (%):  40 % Set Rate:  20 bmp Vt Set: 550 mL PEEP:  5 cmH20 Plateau Pressure:  16 cmH20 Dental  (+) Teeth Intact, Dental Advisory Given   Pulmonary Current Smoker,  Respiratory failure, tracheal polyp   Pulmonary exam normal breath sounds clear to auscultation       Cardiovascular negative cardio ROS Normal cardiovascular exam Rhythm:Regular Rate:Normal     Neuro/Psych negative neurological ROS  negative psych ROS   GI/Hepatic Neg liver ROS, GERD  Medicated,  Endo/Other  negative endocrine ROS  Renal/GU negative Renal ROS     Musculoskeletal negative musculoskeletal ROS (+)   Abdominal   Peds  Hematology  (+) Blood dyscrasia (Thrombocytopenia), anemia ,   Anesthesia Other Findings Day of surgery medications reviewed with the patient.  Reproductive/Obstetrics                             Anesthesia Physical Anesthesia Plan  ASA: III  Anesthesia Plan: General   Post-op Pain Management:    Induction: Intravenous  Airway Management Planned: Oral ETT  Additional Equipment:   Intra-op Plan:   Post-operative Plan: Extubation in OR  Informed Consent: I have reviewed the patients History and Physical, chart, labs and discussed the procedure including the risks, benefits and alternatives for the proposed anesthesia with the patient or authorized representative who has indicated his/her understanding and acceptance.   Dental advisory given  Plan Discussed with: CRNA  Anesthesia Plan Comments: (Risks/benefits of general anesthesia discussed with patient including risk of damage to  teeth, lips, gum, and tongue, nausea/vomiting, allergic reactions to medications, and the possibility of heart attack, stroke and death.  All patient questions answered.  Patient wishes to proceed.)        Anesthesia Quick Evaluation

## 2016-07-23 NOTE — Progress Notes (Signed)
eLink Physician-Brief Progress Note Patient Name: Troy Robbins DOB: November 30, 1970 MRN: [id number]   Date of Service  07/23/2016  HPI/Events of Note  Patient stating he wants to leave hospital.  He is alert and oriented to person, place, and time.  It has been explained that he has a life threatening condition and that he should not leave the hospital.  He understands this and states he wants to leave despite this.  eICU Interventions  AMA paperwork complete.      Intervention Category Evaluation Type: Other  [name], P 07/23/2016, 5:42 PM

## 2016-07-23 NOTE — Consult Note (Signed)
Reason for Consult:airway obstruction Referring Physician: CCM  Troy Robbins is an 45 y.o. male.  HPI: History of airway obstruction. He has had a injury previously and that was approximately over a year ago and was intubated. After the recovery he was having stridor and otolaryngology evaluated him. 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 He also may have had a polyp. He has had some breathing difficulties through the time but he had elected at that time not to proceed with the recommended trach and microlaryngoscopy. He now presented to [facility] with airway obstruction. They emergently intubated him and occurred only get a #6 tube through his glottis. He now has transferred to [facility] have evaluation. He is in custody by the sheriff and has signed a waiver of his medical right and is now under the medical power of attorney of the county. I was asked to see the patient for evaluation of a tracheotomy and diagnosis of his laryngeal pathology.  Past Medical History:  Diagnosis Date  . Acid reflux   . Seasonal allergies     Past Surgical History:  Procedure Laterality Date  . NO PAST SURGERIES    . VIDEO BRONCHOSCOPY N/A 05/27/2015   Procedure: BEDSIDE VIDEO BRONCHOSCOPY;  Surgeon: Melrose Nakayama, MD;  Location: [place];  Service: Thoracic;  Laterality: N/A;    History reviewed. No pertinent family history.  Social History:  reports that he has been smoking Cigarettes.  He has been smoking about 1.00 pack per day. He has never used smokeless tobacco. He reports that he drinks alcohol. He reports that he does not use drugs.  Allergies: No Known Allergies  Medications: I have reviewed the patient's current medications.  Results for orders placed or performed during the hospital encounter of 07/22/16 (from the past 48 hour(s))  CBC with Differential     Status: Abnormal   Collection  Time: 07/22/16  8:30 AM  Result Value Ref Range   WBC 17.7 (H) 4.0 - 10.5 K/uL   RBC 4.19 (L) 4.22 - 5.81 MIL/uL   Hemoglobin 13.4 13.0 - 17.0 g/dL   HCT 41.1 39.0 - 52.0 %   MCV 98.1 78.0 - 100.0 fL   MCH 32.0 26.0 - 34.0 pg   MCHC 32.6 30.0 - 36.0 g/dL   RDW 13.2 11.5 - 15.5 %   Platelets 180 150 - 400 K/uL   Neutrophils Relative % 46 %   Lymphocytes Relative 41 %   Monocytes Relative 10 %   Eosinophils Relative 3 %   Basophils Relative 0 %   Neutro Abs 8.1 (H) 1.7 - 7.7 K/uL   Lymphs Abs 7.3 (H) 0.7 - 4.0 K/uL   Monocytes Absolute 1.8 (H) 0.1 - 1.0 K/uL   Eosinophils Absolute 0.5 0.0 - 0.7 K/uL   Basophils Absolute 0.0 0.0 - 0.1 K/uL   WBC Morphology ATYPICAL LYMPHOCYTES   Comprehensive metabolic panel     Status: Abnormal   Collection Time: 07/22/16  8:30 AM  Result Value Ref Range   Sodium 143 135 - 145 mmol/L   Potassium 3.3 (L) 3.5 - 5.1 mmol/L   Chloride 105 101 - 111 mmol/L   CO2 32 22 - 32 mmol/L   Glucose, Bld 304 (H) 65 - 99 mg/dL   BUN 13 6 - 20 mg/dL   Creatinine, Ser 0.69 0.61 - 1.24 mg/dL   Calcium 8.7 (L)  8.9 - 10.3 mg/dL   Total Protein 7.1 6.5 - 8.1 g/dL   Albumin 4.6 3.5 - 5.0 g/dL   AST 36 15 - 41 U/L   ALT 44 17 - 63 U/L   Alkaline Phosphatase 60 38 - 126 U/L   Total Bilirubin 0.3 0.3 - 1.2 mg/dL   GFR calc non Af Amer >60 >60 mL/min   GFR calc Af Amer >60 >60 mL/min    Comment: (NOTE) The eGFR has been calculated using the CKD EPI equation. This calculation has not been validated in all clinical situations. eGFR's persistently <60 mL/min signify possible Chronic Kidney Disease.    Anion gap 6 5 - 15  Ethanol     Status: None   Collection Time: 07/22/16  8:30 AM  Result Value Ref Range   Alcohol, Ethyl (B) <5 <5 mg/dL    Comment:        LOWEST DETECTABLE LIMIT FOR SERUM ALCOHOL IS 5 mg/dL FOR MEDICAL PURPOSES ONLY   I-Stat CG4 Lactic Acid, ED     Status: Abnormal   Collection Time: 07/22/16  8:44 AM  Result Value Ref Range   Lactic  Acid, Venous 3.40 (HH) 0.5 - 1.9 mmol/L   Comment NOTIFIED PHYSICIAN   I-stat troponin, ED     Status: None   Collection Time: 07/22/16  8:48 AM  Result Value Ref Range   Troponin i, poc 0.00 0.00 - 0.08 ng/mL   Comment 3            Comment: Due to the release kinetics of cTnI, a negative result within the first hours of the onset of symptoms does not rule out myocardial infarction with certainty. If myocardial infarction is still suspected, repeat the test at appropriate intervals.   Urinalysis, Routine w reflex microscopic (not at [facility])     Status: Abnormal   Collection Time: 07/22/16  9:07 AM  Result Value Ref Range   Color, Urine YELLOW YELLOW   APPearance HAZY (A) CLEAR   Specific Gravity, Urine >1.030 (H) 1.005 - 1.030   pH 5.5 5.0 - 8.0   Glucose, UA >1000 (A) NEGATIVE mg/dL   Hgb urine dipstick SMALL (A) NEGATIVE   Bilirubin Urine NEGATIVE NEGATIVE   Ketones, ur NEGATIVE NEGATIVE mg/dL   Protein, ur 100 (A) NEGATIVE mg/dL   Nitrite NEGATIVE NEGATIVE   Leukocytes, UA NEGATIVE NEGATIVE  Urine rapid drug screen (hosp performed)     Status: None   Collection Time: 07/22/16  9:07 AM  Result Value Ref Range   Opiates NONE DETECTED NONE DETECTED   Cocaine NONE DETECTED NONE DETECTED   Benzodiazepines NONE DETECTED NONE DETECTED   Amphetamines NONE DETECTED NONE DETECTED   Tetrahydrocannabinol NONE DETECTED NONE DETECTED   Barbiturates NONE DETECTED NONE DETECTED    Comment:        DRUG SCREEN FOR MEDICAL PURPOSES ONLY.  IF CONFIRMATION IS NEEDED FOR ANY PURPOSE, NOTIFY LAB WITHIN 5 DAYS.        LOWEST DETECTABLE LIMITS FOR URINE DRUG SCREEN Drug Class       Cutoff (ng/mL) Amphetamine      1000 Barbiturate      200 Benzodiazepine   295 Tricyclics       621 Opiates          300 Cocaine          300 THC              50   Urine microscopic-add on  Status: Abnormal   Collection Time: 07/22/16  9:07 AM  Result Value Ref Range   Squamous Epithelial / LPF 0-5 (A)  NONE SEEN   WBC, UA 0-5 0 - 5 WBC/hpf   RBC / HPF 0-5 0 - 5 RBC/hpf   Bacteria, UA FEW (A) NONE SEEN  Blood Culture (routine x 2)     Status: None (Preliminary result)   Collection Time: 07/22/16  9:44 AM  Result Value Ref Range   Specimen Description BLOOD BLOOD RIGHT HAND    Special Requests BOTTLES DRAWN AEROBIC AND ANAEROBIC 6CC    Culture PENDING    Report Status PENDING   Blood Culture (routine x 2)     Status: None (Preliminary result)   Collection Time: 07/22/16  9:56 AM  Result Value Ref Range   Specimen Description BLOOD RIGHT ANTECUBITAL    Special Requests BOTTLES DRAWN AEROBIC AND ANAEROBIC 6CC    Culture PENDING    Report Status PENDING   Blood gas, arterial ([facility] & [facility] ONLY)     Status: Abnormal   Collection Time: 07/22/16 11:20 AM  Result Value Ref Range   FIO2 100.00    Delivery systems VENTILATOR    Mode PRESSURE REGULATED VOLUME CONTROL    VT 500 mL   LHR 16 resp/min   Peep/cpap 5.0 cm H20   pH, Arterial 7.272 (L) 7.350 - 7.450    Comment: RBV B.DANIELS,RN AT 1127 BY V.LAWSON,RRT ON 07/22/16   pCO2 arterial 54.9 (H) 32.0 - 48.0 mmHg    Comment: RBV B.DANIELS,RN AT 1127 BY V.LAWSON ON 07/22/16   pO2, Arterial 176.0 (H) 83.0 - 108.0 mmHg   Bicarbonate 22.4 20.0 - 28.0 mmol/L   TCO2 16.7 0 - 100 mmol/L   Acid-base deficit 1.4 0.0 - 2.0 mmol/L   O2 Saturation 99.1 %   Patient temperature 37.0    Collection site LEFT RADIAL    Drawn by COLLECTED BY RT    Sample type ARTERIAL    Allens test (pass/fail) PASS PASS  Lactic acid, plasma     Status: None   Collection Time: 07/22/16  1:07 PM  Result Value Ref Range   Lactic Acid, Venous 1.6 0.5 - 1.9 mmol/L  Lactic acid, plasma     Status: Abnormal   Collection Time: 07/22/16  4:23 PM  Result Value Ref Range   Lactic Acid, Venous 2.3 (HH) 0.5 - 1.9 mmol/L    Comment: CRITICAL RESULT CALLED TO, READ BACK BY AND VERIFIED WITH: MITCHELL,M. AT 1658 ON 07/22/2016 BY AGUNDIZ,E.   Glucose, capillary     Status: Abnormal    Collection Time: 07/22/16  9:47 PM  Result Value Ref Range   Glucose-Capillary 111 (H) 65 - 99 mg/dL   Comment 1 Notify RN   MRSA PCR Screening     Status: None   Collection Time: 07/22/16 10:10 PM  Result Value Ref Range   MRSA by PCR NEGATIVE NEGATIVE    Comment:        The GeneXpert MRSA Assay (FDA approved for NASAL specimens only), is one component of a comprehensive MRSA colonization surveillance program. It is not intended to diagnose MRSA infection nor to guide or monitor treatment for MRSA infections.   CBC     Status: Abnormal   Collection Time: 07/22/16 10:56 PM  Result Value Ref Range   WBC 11.2 (H) 4.0 - 10.5 K/uL   RBC 4.02 (L) 4.22 - 5.81 MIL/uL   Hemoglobin 12.2 (L) 13.0 - 17.0 g/dL  HCT 38.2 (L) 39.0 - 52.0 %   MCV 95.0 78.0 - 100.0 fL   MCH 30.3 26.0 - 34.0 pg   MCHC 31.9 30.0 - 36.0 g/dL   RDW 13.0 11.5 - 15.5 %   Platelets 140 (L) 150 - 400 K/uL  Creatinine, serum     Status: None   Collection Time: 07/22/16 10:56 PM  Result Value Ref Range   Creatinine, Ser 0.73 0.61 - 1.24 mg/dL   GFR calc non Af Amer >60 >60 mL/min   GFR calc Af Amer >60 >60 mL/min    Comment: (NOTE) The eGFR has been calculated using the CKD EPI equation. This calculation has not been validated in all clinical situations. eGFR's persistently <60 mL/min signify possible Chronic Kidney Disease.   Triglycerides     Status: None   Collection Time: 07/22/16 10:57 PM  Result Value Ref Range   Triglycerides 35 <150 mg/dL  Triglycerides     Status: None   Collection Time: 07/23/16  2:16 AM  Result Value Ref Range   Triglycerides 52 <150 mg/dL  Renal function panel     Status: Abnormal   Collection Time: 07/23/16  2:16 AM  Result Value Ref Range   Sodium 140 135 - 145 mmol/L   Potassium 3.4 (L) 3.5 - 5.1 mmol/L   Chloride 109 101 - 111 mmol/L   CO2 23 22 - 32 mmol/L   Glucose, Bld 123 (H) 65 - 99 mg/dL   BUN 6 6 - 20 mg/dL   Creatinine, Ser 0.59 (L) 0.61 - 1.24 mg/dL    Calcium 8.5 (L) 8.9 - 10.3 mg/dL   Phosphorus 2.5 2.5 - 4.6 mg/dL   Albumin 3.1 (L) 3.5 - 5.0 g/dL   GFR calc non Af Amer >60 >60 mL/min   GFR calc Af Amer >60 >60 mL/min    Comment: (NOTE) The eGFR has been calculated using the CKD EPI equation. This calculation has not been validated in all clinical situations. eGFR's persistently <60 mL/min signify possible Chronic Kidney Disease.    Anion gap 8 5 - 15  CBC with Differential/Platelet     Status: Abnormal   Collection Time: 07/23/16  2:16 AM  Result Value Ref Range   WBC 9.8 4.0 - 10.5 K/uL   RBC 3.93 (L) 4.22 - 5.81 MIL/uL   Hemoglobin 12.0 (L) 13.0 - 17.0 g/dL   HCT 37.0 (L) 39.0 - 52.0 %   MCV 94.1 78.0 - 100.0 fL   MCH 30.5 26.0 - 34.0 pg   MCHC 32.4 30.0 - 36.0 g/dL   RDW 13.0 11.5 - 15.5 %   Platelets 134 (L) 150 - 400 K/uL   Neutrophils Relative % 84 %   Neutro Abs 8.3 (H) 1.7 - 7.7 K/uL   Lymphocytes Relative 12 %   Lymphs Abs 1.2 0.7 - 4.0 K/uL   Monocytes Relative 4 %   Monocytes Absolute 0.3 0.1 - 1.0 K/uL   Eosinophils Relative 0 %   Eosinophils Absolute 0.0 0.0 - 0.7 K/uL   Basophils Relative 0 %   Basophils Absolute 0.0 0.0 - 0.1 K/uL  Hemoglobin A1c     Status: None   Collection Time: 07/23/16  2:16 AM  Result Value Ref Range   Hgb A1c MFr Bld 5.3 4.8 - 5.6 %    Comment: (NOTE)         Pre-diabetes: 5.7 - 6.4         Diabetes: >6.4  Glycemic control for adults with diabetes: <7.0    Mean Plasma Glucose 105 mg/dL    Comment: (NOTE) Performed At: [facility] [place], [place] [id number] Lindon Romp MD OJ:[id number]   Glucose, capillary     Status: Abnormal   Collection Time: 07/23/16  3:30 AM  Result Value Ref Range   Glucose-Capillary 101 (H) 65 - 99 mg/dL   Comment 1 Notify RN   Blood gas, arterial     Status: Abnormal   Collection Time: 07/23/16  4:30 AM  Result Value Ref Range   FIO2 60.00    Delivery systems VENTILATOR    Mode PRESSURE REGULATED VOLUME  CONTROL    VT 550 mL   LHR 20 resp/min   Peep/cpap 5.0 cm H20   pH, Arterial 7.455 (H) 7.350 - 7.450   pCO2 arterial 32.6 32.0 - 48.0 mmHg   pO2, Arterial 228 (H) 83.0 - 108.0 mmHg   Bicarbonate 22.6 20.0 - 28.0 mmol/L   Acid-base deficit 0.8 0.0 - 2.0 mmol/L   O2 Saturation 99.6 %   Patient temperature 98.6    Collection site RIGHT RADIAL    Drawn by [phone]    Sample type ARTERIAL DRAW    Allens test (pass/fail) PASS PASS  Glucose, capillary     Status: Abnormal   Collection Time: 07/23/16  7:43 AM  Result Value Ref Range   Glucose-Capillary 125 (H) 65 - 99 mg/dL  Glucose, capillary     Status: Abnormal   Collection Time: 07/23/16 12:13 PM  Result Value Ref Range   Glucose-Capillary 126 (H) 65 - 99 mg/dL   Comment 1 Notify RN    Comment 2 Document in Chart   APTT     Status: None   Collection Time: 07/23/16  2:18 PM  Result Value Ref Range   aPTT 34 24 - 36 seconds  Protime-INR     Status: Abnormal   Collection Time: 07/23/16  2:18 PM  Result Value Ref Range   Prothrombin Time 15.9 (H) 11.4 - 15.2 seconds   INR 1.26     Ct Head Wo Contrast  Result Date: 07/22/2016 CLINICAL DATA:  Pt c/o SOB that started yesterday. This morning collapsed while sitting on a bench. Pt was unresponsive when EMS arrived. Intubated now.^29m ISOVUE-300 IOPAMIDOL (ISOVUE-300) INJECTION 61% EXAM: CT HEAD WITHOUT CONTRAST TECHNIQUE: Contiguous axial images were obtained from the base of the skull through the vertex without intravenous contrast. COMPARISON:  Facial CT 07/15/2007 FINDINGS: Brain: No intracranial hemorrhage. No parenchymal contusion. No midline shift or mass effect. Basilar cisterns are patent. No skull base fracture. No fluid in the paranasal sinuses or mastoid air cells. Orbits are normal. Vascular: Unremarkable Skull: Unremarkable Sinuses/Orbits: Polypoid thickening in the maxillary sinuses. Mild sclerosis sinus walls. Mucosal thickening in the frontal sinuses. Mastoid air cells clear.  Other: Intubated patient. IMPRESSION: 1. No acute intracranial findings. 2. Chronic sinus inflammation including the frontal sinuses. Electronically Signed   By: SSuzy BouchardM.D.   On: 07/22/2016 11:01   Ct Soft Tissue Neck W Contrast  Result Date: 07/22/2016 CLINICAL DATA:  Shortness of breath began yesterday. Patient collapsed sitting on the bench. Unresponsive. EXAM: CT NECK WITH CONTRAST TECHNIQUE: Multidetector CT imaging of the neck was performed using the standard protocol following the bolus administration of intravenous contrast. CONTRAST:  773mISOVUE-300 IOPAMIDOL (ISOVUE-300) INJECTION 61% COMPARISON:  CT of the neck 07/23/2015. FINDINGS: Pharynx and larynx: Intubated. ET tube in good position. Diffuse edema of  the hypo pharyngeal/ supraglottic soft tissues could represent allergic reaction. Neoplasm is less favored. A similar appearance was present in 2016, but today's findings are more severe. No discrete enhancing mass. Salivary glands: No inflammation, mass, or stone. Thyroid: Normal. Lymph nodes: None enlarged or abnormal density. Vascular: Negative. Limited intracranial: Negative. Visualized orbits: Negative. Mastoids and visualized paranasal sinuses: Clear. Skeleton: No acute or aggressive process. Upper chest: Reported separately. Other: None. IMPRESSION: Diffuse supraglottic/hypopharyngeal edema of uncertain etiology. Endotracheal tube in good position, with maintenance of the airway. See discussion above. Electronically Signed   By: Staci Righter M.D.   On: 07/22/2016 11:12   Ct Chest W Contrast  Result Date: 07/22/2016 CLINICAL DATA:  Short breath.  Collapse. EXAM: CT CHEST WITH CONTRAST TECHNIQUE: Multidetector CT imaging of the chest was performed during intravenous contrast administration. CONTRAST:  39m ISOVUE-300 IOPAMIDOL (ISOVUE-300) INJECTION 61% COMPARISON:  Chest radiograph 07/22/2016 FINDINGS: Cardiovascular: No acute findings aorta great vessels. No pericardial fluid.  Pulmonary arteries not evaluated. Mediastinum/Nodes: No axillary supraclavicular adenopathy. No mediastinal hilar adenopathy. No pericardial effusion. NG tube extends the stomach. Lungs/Pleura: There is dense LEFT lower lobe consolidation with air bronchograms. Dense posterior RIGHT upper lobe consolidation with air bronchograms. More mild peripheral consolidation in the RIGHT lower lobe. No pneumothorax. Upper Abdomen: Limited view of the liver, kidneys, pancreas are unremarkable. Normal adrenal glands. Musculoskeletal: No aggressive osseous lesion. IMPRESSION: 1. Dense consolidation within the LEFT lower lobe and RIGHT upper lobe are most suggestive of multifocal lobar pneumonia. Aspiration pneumonitis could have typical picture with superimposed infection. No evidence of mucous plugging or bronchial obstruction. 2. Intubated patient with NG tube. Electronically Signed   By: SSuzy BouchardM.D.   On: 07/22/2016 11:12   Dg Chest Port 1 View  Result Date: 07/23/2016 CLINICAL DATA:  Aspiration PNA EXAM: PORTABLE CHEST 1 VIEW COMPARISON:  07/22/2016 FINDINGS: Endotracheal tube and NG tube unchanged. Dramatic improvement in RIGHT upper lobe and LEFT lower lobe atelectasis/ consolidation. No pneumothorax. IMPRESSION: 1. Stable support apparatus. 2. Dramatic improvement in RIGHT upper lobe and LEFT lower lobe consolidation suggests atelectasis as underlying etiology of consolidation. Electronically Signed   By: SSuzy BouchardM.D.   On: 07/23/2016 07:16   Dg Chest Port 1 View  Result Date: 07/22/2016 CLINICAL DATA:  Airway obstruction EXAM: PORTABLE CHEST 1 VIEW COMPARISON:  CT 07/22/2016 FINDINGS: Endotracheal tube 5.3 cm from carina. NG tube extends the stomach. Normal cardiac silhouette. Dense RIGHT upper lobe and LEFT lower lobe consolidation compares to same day CT. No pneumothorax. IMPRESSION: Endotracheal tube in good position. Bilateral pulmonary consolidation similar to CT same day. Electronically  Signed   By: SSuzy BouchardM.D.   On: 07/22/2016 12:25   Dg Chest Portable 1 View  Result Date: 07/22/2016 CLINICAL DATA:  Status post intubation. EXAM: PORTABLE CHEST 1 VIEW COMPARISON:  03/19/2016 FINDINGS: Endotracheal tube tip projects 4.2 cm above the carina. Nasal/orogastric tube passes below the diaphragm and below the included field of view. Cardiac silhouette is normal in size. No mediastinal or hilar masses. Hazy airspace opacity projects along the left perihilar and lower lung. On the right, there is hazy airspace opacity that is most evident in the right upper lobe. No pneumothorax. IMPRESSION: 1. Bilateral hazy airspace lung opacities which may reflect asymmetric edema or bilateral pneumonia. 2. Endotracheal tube tip projects 4.2 cm above the carina. Nasal/orogastric tube passes into the stomach. Electronically Signed   By: DLajean ManesM.D.   On: 07/22/2016 09:32  ROS Blood pressure 133/75, pulse 91, temperature 98.3 F (36.8 C), temperature source Oral, resp. rate 18, weight 75.3 kg (166 lb 0.1 oz), SpO2 100 %. Physical Exam  Constitutional: He appears well-developed and well-nourished.  HENT:  Head: Normocephalic and atraumatic.  There is an ET tube in his mouth. He is ventilating well. He is fully awake and trying to all around it. Nose is clear. Neck is thin without any masses or swelling  Eyes: Conjunctivae and EOM are normal. Pupils are equal, round, and reactive to light.    Assessment/Plan: Laryngeal mass/stenosis/airway obstruction-I spent over an hour in the patient's room with discussion about a tracheotomy and microlaryngoscopy with him writing for communication purposes. He initially consented to the procedure with Dr. Titus Mould talking to him. It seems as though when the time was coming close to getting the procedure he began getting uncomfortable. He was writing things such as he was told there would  be no scar. He thought that the polyp could be removed and he  would be fine. He was told the trach would be sewn shut after the procedure. None of these facts are indeed the case. He demanded to know how long the trach would stay again. I explained to him that there is no way of knowing how long the tracheotomy would have to be in position. It is to save his life and the endotracheal tube that he currently has in is unstable and will cause more damage to his larynx if left in. He has apparently given his power of attorney to the [place] in the room stated that they have jurisdiction and that he will undergo the procedure. The patient continued to write that he was unsure about it. I continue to try to explain to him what a tracheotomy is and how it performs. That he will have a voice after approximately 3 or 4 days after changing to an uncuffed or figure out his laryngeal pathology. He may need multiple procedures to laser a stenosis. After 45 minutes of discussing this the sheriff stated that there is some issue with the judge and that he may be released and therefore they will no longer have jurisdiction over him. During that conversation the patient after I left the room pulled the endotracheal tube out. He immediately was able to phonate and began complaining about the incarceration and the fact that he wasn't welcomed in some formal way to [facility]. I tried to speak just about the medical issues and have him consent if he wanted to proceed and he continued with discussing other items. At this point if he is released from his medical power of attorney he would be on his own to consent and that is not possible currently with him having sedation. He will be observed now and reintubated if necessary but right now he is still contemplating the tracheotomy and microlaryngoscopy. He was informed a risk and benefits of the procedure and options. All his questions were answered about the procedure. I will follow-up bit later to see if he wants to  discuss it further and has made a decision regarding his care. Dr. Titus Mould has been informed and was present through all of this activity and will observe him without any intervention for now.  Melissa Montane 07/23/2016, 4:13 PM

## 2016-07-23 NOTE — H&P (Signed)
PULMONARY / CRITICAL CARE MEDICINE   Name: Troy Robbins MRN: [id number] DOB: 19-Jun-1971    ADMISSION DATE:  07/22/2016 CONSULTATION DATE:  07/22/16   REFERRING MD:  Dr. Rogene Robbins  CHIEF COMPLAINT:  Respiratory failure  HISTORY OF PRESENT ILLNESS:   Mr. Troy Robbins is a 45 y/o man who was transferred from [facility] ED after he was intubated for respiratory distress.  Per records he started complaining of shortness of breath the day prior to admission while in jail. He then was noted to lose consciousness and EMS was called.  His intubated in the ED was quite difficult and required multiple attempts due to inability to pass the ETT past the vocal cords.  He was finally intubated with a 6.5 ETT.  A CT scan showed narrowing of the suppraglottic soft tissues.  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  He did not require intubation at that time but was seen by ENT who recommended awake tracheostomy and direct laryngoscopy.  He decided against their recommendations and eventually improved with medical management.   PAST MEDICAL HISTORY :  He  has a past medical history of Acid reflux and Seasonal allergies.  PAST SURGICAL HISTORY: He  has a past surgical history that includes Video bronchoscopy (N/A, 05/27/2015) and No past surgeries.  No Known Allergies  No current facility-administered medications on file prior to encounter.    Current Outpatient Prescriptions on File Prior to Encounter  Medication Sig  . acetaminophen (TYLENOL) 325 MG tablet Take 2 tablets (650 mg total) by mouth every 6 (six) hours as needed for mild pain. (Patient not taking: Reported on 07/23/2015)  . amoxicillin-clavulanate (AUGMENTIN) 875-125 MG per tablet Take 1 tablet by mouth 2 (two) times daily. (Patient not taking: Reported on 07/22/2016)  . benzonatate (TESSALON) 100 MG capsule Take 2 capsules (200 mg total) by mouth 3 (three) times  daily as needed. (Patient not taking: Reported on 07/22/2016)  . docusate sodium (COLACE) 100 MG capsule Take 1 capsule (100 mg total) by mouth 2 (two) times daily. (Patient not taking: Reported on 07/22/2016)  . folic acid (FOLVITE) 1 MG tablet Take 1 tablet (1 mg total) by mouth daily. (Patient not taking: Reported on 07/22/2016)  . methocarbamol (ROBAXIN) 500 MG tablet Take 2 tablets (1,000 mg total) by mouth every 8 (eight) hours as needed for muscle spasms (or pain). (Patient not taking: Reported on 07/23/2015)  . Multiple Vitamin (MULTIVITAMIN WITH MINERALS) TABS tablet Take 1 tablet by mouth daily. (Patient not taking: Reported on 07/23/2015)  . omeprazole (PRILOSEC) 40 MG capsule Take 1 capsule (40 mg total) by mouth daily. (Patient not taking: Reported on 07/22/2016)  . oxyCODONE (OXY IR/ROXICODONE) 5 MG immediate release tablet Take 1-3 tablets (5-15 mg total) by mouth every 6 (six) hours as needed for moderate pain, severe pain or breakthrough pain. (Patient not taking: Reported on 07/22/2016)  . polyethylene glycol (MIRALAX / GLYCOLAX) packet Take 17 g by mouth daily. (Patient not taking: Reported on 07/22/2016)  . predniSONE (DELTASONE) 20 MG tablet Take 3 tablets (60 mg total) by mouth daily with breakfast. Take 3 tablets by mouth once daily for 7 days, then 2 tablets once daily for 3 days, then 1 tablet once daily for 3 days. (Patient not taking: Reported on 07/22/2016)  . thiamine 100 MG tablet Take 1 tablet (100 mg total) by mouth daily. (Patient not taking: Reported on 07/22/2016)  FAMILY HISTORY:  His has no family status information on file.    SOCIAL HISTORY: He  reports that he has been smoking Cigarettes.  He has been smoking about 1.00 pack per day. He has never used smokeless tobacco. He reports that he drinks alcohol. He reports that he does not use drugs.  REVIEW OF SYSTEMS:   Could not be obtained as pt is intubated  SUBJECTIVE:  45 y/o man with  suppraglottic/hypophyaryngeal edema vs mass resulting in respiratory failure.    VITAL SIGNS: BP 101/64   Pulse 61   Temp 97.4 F (36.3 C) (Oral)   Resp 20   Wt 75.3 kg (166 lb 0.1 oz)   SpO2 99%   BMI 24.51 kg/m   HEMODYNAMICS:    VENTILATOR SETTINGS: Vent Mode: PRVC FiO2 (%):  [40 %-100 %] 40 % Set Rate:  [16 bmp-20 bmp] 20 bmp Vt Set:  [500 mL-550 mL] 550 mL PEEP:  [5 cmH20] 5 cmH20 Plateau Pressure:  [12 cmH20-18 cmH20] 18 cmH20  INTAKE / OUTPUT: I/O last 3 completed shifts: In: 4283.1 [I.V.:3923.1; Other:60; NG/GT:50; IV Piggyback:250] Out: 2020 [Urine:1770; Emesis/NG output:250]  PHYSICAL EXAMINATION: General:  Laying in bed Neuro:  Alert, gesturing questions HEENT:  PERRL, EOMI, ETT in place Cardiovascular:  NRRR, no MRG Lungs:  CTAB anteriorly Abdomen:  Soft, NTND, no HSM Musculoskeletal:  No edema Skin:  No rashes, bruises or other lesions.   LABS:  BMET  Recent Labs Lab 07/22/16 0830 07/22/16 2256 07/23/16 0216  NA 143  --  140  K 3.3*  --  3.4*  CL 105  --  109  CO2 32  --  23  BUN 13  --  6  CREATININE 0.69 0.73 0.59*  GLUCOSE 304*  --  123*    Electrolytes  Recent Labs Lab 07/22/16 0830 07/23/16 0216  CALCIUM 8.7* 8.5*  PHOS  --  2.5    CBC  Recent Labs Lab 07/22/16 0830 07/22/16 2256 07/23/16 0216  WBC 17.7* 11.2* 9.8  HGB 13.4 12.2* 12.0*  HCT 41.1 38.2* 37.0*  PLT 180 140* 134*    Coag's No results for input(s): APTT, INR in the last 168 hours.  Sepsis Markers  Recent Labs Lab 07/22/16 0844 07/22/16 1307 07/22/16 1623  LATICACIDVEN 3.40* 1.6 2.3*    ABG  Recent Labs Lab 07/22/16 1120 07/23/16 0430  PHART 7.272* 7.455*  PCO2ART 54.9* 32.6  PO2ART 176.0* 228*    Liver Enzymes  Recent Labs Lab 07/22/16 0830 07/23/16 0216  AST 36  --   ALT 44  --   ALKPHOS 60  --   BILITOT 0.3  --   ALBUMIN 4.6 3.1*    Cardiac Enzymes No results for input(s): TROPONINI, PROBNP in the last 168  hours.  Glucose  Recent Labs Lab 07/22/16 2147 07/23/16 0330 07/23/16 0743  GLUCAP 111* 101* 125*    Imaging Ct Head Wo Contrast  Result Date: 07/22/2016 CLINICAL DATA:  Pt c/o SOB that started yesterday. This morning collapsed while sitting on a bench. Pt was unresponsive when EMS arrived. Intubated now.^26mL ISOVUE-300 IOPAMIDOL (ISOVUE-300) INJECTION 61% EXAM: CT HEAD WITHOUT CONTRAST TECHNIQUE: Contiguous axial images were obtained from the base of the skull through the vertex without intravenous contrast. COMPARISON:  Facial CT 07/15/2007 FINDINGS: Brain: No intracranial hemorrhage. No parenchymal contusion. No midline shift or mass effect. Basilar cisterns are patent. No skull base fracture. No fluid in the paranasal sinuses or mastoid air cells. Orbits are normal. Vascular: Unremarkable  Skull: Unremarkable Sinuses/Orbits: Polypoid thickening in the maxillary sinuses. Mild sclerosis sinus walls. Mucosal thickening in the frontal sinuses. Mastoid air cells clear. Other: Intubated patient. IMPRESSION: 1. No acute intracranial findings. 2. Chronic sinus inflammation including the frontal sinuses. Electronically Signed   By: Suzy Bouchard M.D.   On: 07/22/2016 11:01   Ct Soft Tissue Neck W Contrast  Result Date: 07/22/2016 CLINICAL DATA:  Shortness of breath began yesterday. Patient collapsed sitting on the bench. Unresponsive. EXAM: CT NECK WITH CONTRAST TECHNIQUE: Multidetector CT imaging of the neck was performed using the standard protocol following the bolus administration of intravenous contrast. CONTRAST:  60mL ISOVUE-300 IOPAMIDOL (ISOVUE-300) INJECTION 61% COMPARISON:  CT of the neck 07/23/2015. FINDINGS: Pharynx and larynx: Intubated. ET tube in good position. Diffuse edema of the hypo pharyngeal/ supraglottic soft tissues could represent allergic reaction. Neoplasm is less favored. A similar appearance was present in 2016, but today's findings are more severe. No discrete enhancing  mass. Salivary glands: No inflammation, mass, or stone. Thyroid: Normal. Lymph nodes: None enlarged or abnormal density. Vascular: Negative. Limited intracranial: Negative. Visualized orbits: Negative. Mastoids and visualized paranasal sinuses: Clear. Skeleton: No acute or aggressive process. Upper chest: Reported separately. Other: None. IMPRESSION: Diffuse supraglottic/hypopharyngeal edema of uncertain etiology. Endotracheal tube in good position, with maintenance of the airway. See discussion above. Electronically Signed   By: Staci Righter M.D.   On: 07/22/2016 11:12   Ct Chest W Contrast  Result Date: 07/22/2016 CLINICAL DATA:  Short breath.  Collapse. EXAM: CT CHEST WITH CONTRAST TECHNIQUE: Multidetector CT imaging of the chest was performed during intravenous contrast administration. CONTRAST:  41mL ISOVUE-300 IOPAMIDOL (ISOVUE-300) INJECTION 61% COMPARISON:  Chest radiograph 07/22/2016 FINDINGS: Cardiovascular: No acute findings aorta great vessels. No pericardial fluid. Pulmonary arteries not evaluated. Mediastinum/Nodes: No axillary supraclavicular adenopathy. No mediastinal hilar adenopathy. No pericardial effusion. NG tube extends the stomach. Lungs/Pleura: There is dense LEFT lower lobe consolidation with air bronchograms. Dense posterior RIGHT upper lobe consolidation with air bronchograms. More mild peripheral consolidation in the RIGHT lower lobe. No pneumothorax. Upper Abdomen: Limited view of the liver, kidneys, pancreas are unremarkable. Normal adrenal glands. Musculoskeletal: No aggressive osseous lesion. IMPRESSION: 1. Dense consolidation within the LEFT lower lobe and RIGHT upper lobe are most suggestive of multifocal lobar pneumonia. Aspiration pneumonitis could have typical picture with superimposed infection. No evidence of mucous plugging or bronchial obstruction. 2. Intubated patient with NG tube. Electronically Signed   By: Suzy Bouchard M.D.   On: 07/22/2016 11:12   Dg Chest  Port 1 View  Result Date: 07/23/2016 CLINICAL DATA:  Aspiration PNA EXAM: PORTABLE CHEST 1 VIEW COMPARISON:  07/22/2016 FINDINGS: Endotracheal tube and NG tube unchanged. Dramatic improvement in RIGHT upper lobe and LEFT lower lobe atelectasis/ consolidation. No pneumothorax. IMPRESSION: 1. Stable support apparatus. 2. Dramatic improvement in RIGHT upper lobe and LEFT lower lobe consolidation suggests atelectasis as underlying etiology of consolidation. Electronically Signed   By: Suzy Bouchard M.D.   On: 07/23/2016 07:16   Dg Chest Port 1 View  Result Date: 07/22/2016 CLINICAL DATA:  Airway obstruction EXAM: PORTABLE CHEST 1 VIEW COMPARISON:  CT 07/22/2016 FINDINGS: Endotracheal tube 5.3 cm from carina. NG tube extends the stomach. Normal cardiac silhouette. Dense RIGHT upper lobe and LEFT lower lobe consolidation compares to same day CT. No pneumothorax. IMPRESSION: Endotracheal tube in good position. Bilateral pulmonary consolidation similar to CT same day. Electronically Signed   By: Suzy Bouchard M.D.   On: 07/22/2016 12:25   Dg  Chest Portable 1 View  Result Date: 07/22/2016 CLINICAL DATA:  Status post intubation. EXAM: PORTABLE CHEST 1 VIEW COMPARISON:  03/19/2016 FINDINGS: Endotracheal tube tip projects 4.2 cm above the carina. Nasal/orogastric tube passes below the diaphragm and below the included field of view. Cardiac silhouette is normal in size. No mediastinal or hilar masses. Hazy airspace opacity projects along the left perihilar and lower lung. On the right, there is hazy airspace opacity that is most evident in the right upper lobe. No pneumothorax. IMPRESSION: 1. Bilateral hazy airspace lung opacities which may reflect asymmetric edema or bilateral pneumonia. 2. Endotracheal tube tip projects 4.2 cm above the carina. Nasal/orogastric tube passes into the stomach. Electronically Signed   By: Lajean Manes M.D.   On: 07/22/2016 09:32     STUDIES:  CT head, neck and chest -  9/16  CULTURES: Blood x2  ANTIBIOTICS: Vanc, zosyn 9/16 --->   SIGNIFICANT EVENTS: Intubated 9/16  LINES/TUBES: ETT 6.5 9/16  DISCUSSION: 45 y/o with respiratory failure 2/2 tracheal lesion/edema.  ASSESSMENT / PLAN:  PULMONARY A: Respiratory failure P:   Wean ventilator settings as able Consult ENT for evaluation of tracheal lesion  GASTROINTESTINAL A:   Risk of stress ulcer P:   protonix for stress ulcer prophylaxis  INFECTIOUS A:   Possible aspiration event P:   Vancomycin, zosyn  NEUROLOGIC A:   sedation P:   RASS goal: 0 Daily sedation hold   FAMILY  - Updates: Incarcerated - no family at bedside  - Inter-disciplinary family meet or Palliative Care meeting due by:  9/23    Pulmonary and [place] Pager: [phone]  07/23/2016, 8:51 AM

## 2016-07-23 NOTE — Progress Notes (Signed)
This rn notes each ankle cuffed to fob, deputy sheriff remains at bedside

## 2016-07-23 NOTE — Progress Notes (Signed)
Pt denies allergy to medications

## 2016-07-23 NOTE — Progress Notes (Signed)
eLink Physician-Brief Progress Note Patient Name: Troy Robbins DOB: Nov 12, 1970 MRN: [id number]   Date of Service  07/23/2016  HPI/Events of Note  Notified by bedside nurse patient currently in sinus bradycardia. Currently normotensive. On propofol at a rate of 30. Patient also notably hyperglycemic on previous labs.   eICU Interventions  1. Scheduling Solu-Medrol 60 mg IV every 6 hours 2. Checking hemoglobin A1c 3. Sliding-scale insulin per sensitive algorithm with Accu-Cheks every 4 hours 4. Switching from propofol to Versed pushes intermittently for sedation      Intervention Category Intermediate Interventions: Arrhythmia - evaluation and management  Tera Partridge 07/23/2016, 1:12 AM

## 2016-07-23 NOTE — Progress Notes (Addendum)
PULMONARY / CRITICAL CARE MEDICINE   Name: Troy Robbins MRN: [id number] DOB: 1971/02/13    ADMISSION DATE:  07/22/2016 CONSULTATION DATE:  07/22/16   REFERRING MD:  Dr. Rogene Houston  CHIEF COMPLAINT:  Respiratory failure  HISTORY OF PRESENT ILLNESS:   Troy Robbins is a 45 y/o man who was transferred from [facility] after he was intubated for respiratory distress.  Per records he started complaining of shortness of breath the day prior to admission while in jail. He then was noted to lose consciousness and EMS was called.  His intubated in the ED was quite difficult and required multiple attempts due to inability to pass the ETT past the vocal cords.  He was finally intubated with a 6.5 ETT.  A CT scan showed narrowing of the suppraglottic soft tissues.  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  He did not require intubation at that time but was seen by ENT who recommended awake tracheostomy and direct laryngoscopy.  He decided against their recommendations and eventually improved with medical management.   SUBJECTIVE:  Resting in bed,awake writing notes Officer at bedside   VITAL SIGNS: BP 103/63   Pulse (!) 56   Temp 98.3 F (36.8 C) (Oral)   Resp (!) 21   Wt 75.3 kg (166 lb 0.1 oz)   SpO2 100%   BMI 24.51 kg/m   HEMODYNAMICS:    VENTILATOR SETTINGS: Vent Mode: PRVC FiO2 (%):  [40 %-100 %] 40 % Set Rate:  [16 bmp-20 bmp] 20 bmp Vt Set:  [500 mL-550 mL] 550 mL PEEP:  [5 cmH20] 5 cmH20 Plateau Pressure:  [12 cmH20-18 cmH20] 16 cmH20  INTAKE / OUTPUT: I/O last 3 completed shifts: In: 4283.1 [I.V.:3923.1; Other:60; NG/GT:50; IV Piggyback:250] Out: 2020 [Urine:1770; Emesis/NG output:250]  PHYSICAL EXAMINATION: General:  Laying in bed, writing notes  Neuro:  Alert, f/c  HEENT:  PERRL, EOMI, ETT in place Cardiovascular:  NRRR, no MRG Lungs:  CTAB anteriorly Abdomen:  Soft, NTND, no  HSM Musculoskeletal:  No edema Skin:  No rashes, bruises or other lesions.   LABS:  BMET  Recent Labs Lab 07/22/16 0830 07/22/16 2256 07/23/16 0216  NA 143  --  140  K 3.3*  --  3.4*  CL 105  --  109  CO2 32  --  23  BUN 13  --  6  CREATININE 0.69 0.73 0.59*  GLUCOSE 304*  --  123*    Electrolytes  Recent Labs Lab 07/22/16 0830 07/23/16 0216  CALCIUM 8.7* 8.5*  PHOS  --  2.5    CBC  Recent Labs Lab 07/22/16 0830 07/22/16 2256 07/23/16 0216  WBC 17.7* 11.2* 9.8  HGB 13.4 12.2* 12.0*  HCT 41.1 38.2* 37.0*  PLT 180 140* 134*    Coag's No results for input(s): APTT, INR in the last 168 hours.  Sepsis Markers  Recent Labs Lab 07/22/16 0844 07/22/16 1307 07/22/16 1623  LATICACIDVEN 3.40* 1.6 2.3*    ABG  Recent Labs Lab 07/22/16 1120 07/23/16 0430  PHART 7.272* 7.455*  PCO2ART 54.9* 32.6  PO2ART 176.0* 228*    Liver Enzymes  Recent Labs Lab 07/22/16 0830 07/23/16 0216  AST 36  --   ALT 44  --   ALKPHOS 60  --   BILITOT 0.3  --   ALBUMIN 4.6 3.1*    Cardiac Enzymes No results for input(s): TROPONINI, PROBNP in  the last 168 hours.  Glucose  Recent Labs Lab 07/22/16 2147 07/23/16 0330 07/23/16 0743 07/23/16 1213  GLUCAP 111* 101* 125* 126*    Imaging Dg Chest Port 1 View  Result Date: 07/23/2016 CLINICAL DATA:  Aspiration PNA EXAM: PORTABLE CHEST 1 VIEW COMPARISON:  07/22/2016 FINDINGS: Endotracheal tube and NG tube unchanged. Dramatic improvement in RIGHT upper lobe and LEFT lower lobe atelectasis/ consolidation. No pneumothorax. IMPRESSION: 1. Stable support apparatus. 2. Dramatic improvement in RIGHT upper lobe and LEFT lower lobe consolidation suggests atelectasis as underlying etiology of consolidation. Electronically Signed   By: Suzy Bouchard M.D.   On: 07/23/2016 07:16     STUDIES:  CT head, neck and chest - 9/16>dense consolidation in LLL and RUL , diffuse supraglottic/hypopharyngeal edema   CULTURES: Blood  x2>   ANTIBIOTICS: Vanc 9/16>>> zosyn 9/16 --->   SIGNIFICANT EVENTS: Intubated 9/16  LINES/TUBES: ETT 6.5 9/16  DISCUSSION: 45 y/o with respiratory failure 2/2 tracheal lesion/edema.  ASSESSMENT / PLAN:  PULMONARY A: Respiratory failure P:   Wean ventilator settings as able Consult ENT for evaluation of tracheal lesion  GASTROINTESTINAL A:   Risk of stress ulcer P:   protonix for stress ulcer prophylaxis  INFECTIOUS A:   Possible aspiration event P:   Vancomycin, zosyn  NEUROLOGIC A:   sedation P:   RASS goal: 0 to -1 Daily sedation hold  RENAL  Hypokalemia   Plan  K+ replaced.    FAMILY  - Updates: Incarcerated - no family at bedside  - Inter-disciplinary family meet or Palliative Care meeting due by:  9/23   Troy Edison NP-C  Pulmonary and [place] Pager: [phone]  07/23/2016, 12:39 PM  STAFF NOTE: I, Troy Roof, MD FACP have personally reviewed patient's available data, including medical history, events of note, physical examination and test results as part of my evaluation. I have discussed with resident/NP and other care providers such as pharmacist, RN and RRT. In addition, I personally evaluated patient and elicited key findings of: awake, O x 4, able to communicate, no neck lymphadenopathy, CTA slight coarse, CT with infiltrate, clinical c/w glottic obtsuciton r/o tracheal stenosis, mass etc, pt needs trach followed by larygoscopy, ETT removal high risk death. I consented trach by pt and Troy Robbins, get coags, no TF, dc vanc, keep zosyn, after trach cto trach collar. I updated th ept extensive The patient is critically ill with multiple organ systems failure and requires high complexity decision making for assessment and support, frequent evaluation and titration of therapies, application of advanced monitoring technologies and extensive interpretation of multiple databases.   Critical Care Time  devoted to patient care services described in this note is 35 Minutes. This time reflects time of care of this signee: Troy Roof, MD FACP. This critical care time does not reflect procedure time, or teaching time or supervisory time of PA/NP/Med student/Med Resident etc but could involve care discussion time. Rest per NP/medical resident whose note is outlined above and that I agree with   Troy Robbins. Troy Mould, MD, [place] Pgr: [facility] 07/23/2016 1:26 PM   Additional management throughout day , now CCM time 90 min   D/w Troy Robbins, and pt consented trach, then with ENt at bedside he wasn't  clear on approving. Then self extubated.  Multiple questions answered on benefits of airway management. Plan was to observe in ICU,post extubation , he was phonating and no distress. Speaking. Moving air and no stridor.  Police said he is free by them . Rn called as pt now Alert O x 4 wanted to go AMA. I told Rn to state he has high chance to die if he leaves. Had Elink MD also assist as I was in other emergency. Troy Robbins. Troy Mould, MD, [place] Pgr: [place]

## 2016-07-23 NOTE — Progress Notes (Signed)
[facility] ADULT ICU REPLACEMENT PROTOCOL FOR AM LAB REPLACEMENT ONLY  The patient does apply for the [facility] Adult ICU Electrolyte Replacment Protocol based on the criteria listed below:   1. Is GFR >/= 40 ml/min? Yes.    Patient's GFR today is >60 2. Is urine output >/= 0.5 ml/kg/hr for the last 6 hours? Yes.   Patient's UOP is 1.4 ml/kg/hr 3. Is BUN < 60 mg/dL? Yes.    Patient's BUN today is 6 4. Abnormal electrolyte  K 3.4 5. Ordered repletion with: per protocol 6. If a panic level lab has been reported, has the CCM MD in charge been notified? Yes.  .   Physician:  Vickii Penna 07/23/2016 4:14 AM

## 2016-07-24 ENCOUNTER — Encounter (HOSPITAL_COMMUNITY): Payer: Self-pay | Admitting: *Deleted

## 2016-07-24 ENCOUNTER — Emergency Department (HOSPITAL_COMMUNITY): Payer: Self-pay

## 2016-07-24 ENCOUNTER — Emergency Department (HOSPITAL_COMMUNITY)
Admission: EM | Admit: 2016-07-24 | Discharge: 2016-07-24 | Discharge status: Against Medical Advice | Payer: Self-pay | Attending: Emergency Medicine | Admitting: Emergency Medicine

## 2016-07-24 DIAGNOSIS — R061 Stridor: Secondary | ICD-10-CM

## 2016-07-24 DIAGNOSIS — J386 Stenosis of larynx: Secondary | ICD-10-CM

## 2016-07-24 DIAGNOSIS — Z79899 Other long term (current) drug therapy: Secondary | ICD-10-CM | POA: Insufficient documentation

## 2016-07-24 DIAGNOSIS — F17210 Nicotine dependence, cigarettes, uncomplicated: Secondary | ICD-10-CM | POA: Insufficient documentation

## 2016-07-24 DIAGNOSIS — Z792 Long term (current) use of antibiotics: Secondary | ICD-10-CM | POA: Insufficient documentation

## 2016-07-24 MED ORDER — PREDNISONE 20 MG PO TABS: 60.0000 mg | ORAL_TABLET | Freq: Every day | ORAL | 0 refills | Status: DC

## 2016-07-24 MED ORDER — ALBUTEROL SULFATE (2.5 MG/3ML) 0.083% IN NEBU
5.0000 mg | INHALATION_SOLUTION | Freq: Once | RESPIRATORY_TRACT | Status: AC
  Administered 2016-07-24: 5 mg via RESPIRATORY_TRACT
  Filled 2016-07-24: qty 6

## 2016-07-24 MED ORDER — RACEPINEPHRINE HCL 2.25 % IN NEBU
0.5000 mL | INHALATION_SOLUTION | Freq: Once | RESPIRATORY_TRACT | Status: AC
  Administered 2016-07-24: 0.5 mL via RESPIRATORY_TRACT
  Filled 2016-07-24: qty 0.5

## 2016-07-24 MED ORDER — METHYLPREDNISOLONE SODIUM SUCC 125 MG IJ SOLR
125.0000 mg | Freq: Once | INTRAMUSCULAR | Status: AC
  Administered 2016-07-24: 125 mg via INTRAVENOUS
  Filled 2016-07-24: qty 2

## 2016-07-24 NOTE — ED Notes (Signed)
Pt states he understands the risks of leaving AMA including death. Pt states "I just have a few things to take care of and some contacts to get, but I am giving it serious thought".
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
## 2016-07-24 NOTE — ED Triage Notes (Signed)
Pt c/o sob that started yesterday; pt is c/o chest pain

## 2016-07-24 NOTE — Discharge Instructions (Signed)
You're having difficulty breathing because of a blockage in your upper airway. If this blockage progresses, you will not be able to breathe at all. When this happens, you will either die, or suffer irreparable brain injury from not enough oxygen. I have recommended that you be admitted to [facility] immediately because this is a potentially life-threatening condition. You have refused to do this. If you change your mind, return to the emergency department so that appropriate arrangements can be made.

## 2016-07-25 LAB — URINE CULTURE: CULTURE: NO GROWTH

## 2016-07-27 LAB — CULTURE, BLOOD (ROUTINE X 2)
CULTURE: NO GROWTH
CULTURE: NO GROWTH
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
## 2016-08-28 ENCOUNTER — Emergency Department (HOSPITAL_COMMUNITY)

## 2016-08-28 ENCOUNTER — Encounter (HOSPITAL_COMMUNITY): Payer: Self-pay | Admitting: Emergency Medicine

## 2016-08-28 ENCOUNTER — Emergency Department (HOSPITAL_COMMUNITY)
Admission: EM | Admit: 2016-08-28 | Discharge: 2016-08-28 | Disposition: A | Discharge status: Home / Self Care | Attending: Emergency Medicine | Admitting: Emergency Medicine

## 2016-08-28 DIAGNOSIS — R079 Chest pain, unspecified: Secondary | ICD-10-CM | POA: Insufficient documentation

## 2016-08-28 DIAGNOSIS — F17210 Nicotine dependence, cigarettes, uncomplicated: Secondary | ICD-10-CM | POA: Insufficient documentation

## 2016-08-28 DIAGNOSIS — R061 Stridor: Secondary | ICD-10-CM | POA: Insufficient documentation

## 2016-08-28 HISTORY — DX: Other injury of unspecified body region, initial encounter: T14.8XXA

## 2016-08-28 LAB — CBC
HEMATOCRIT: 45.8 % (ref 39.0–52.0)
HEMOGLOBIN: 15.1 g/dL (ref 13.0–17.0)
MCH: 32.5 pg (ref 26.0–34.0)
MCHC: 33 g/dL (ref 30.0–36.0)
MCV: 98.5 fL (ref 78.0–100.0)
Platelets: 196 10*3/uL (ref 150–400)
RBC: 4.65 MIL/uL (ref 4.22–5.81)
RDW: 14.1 % (ref 11.5–15.5)
WBC: 6.2 10*3/uL (ref 4.0–10.5)

## 2016-08-28 LAB — BLOOD GAS, ARTERIAL
ACID-BASE EXCESS: 5.5 mmol/L — AB (ref 0.0–2.0)
BICARBONATE: 28.1 mmol/L — AB (ref 20.0–28.0)
DRAWN BY: 270161
FIO2: 0.21
O2 Saturation: 99.1 %
PCO2 ART: 56.2 mmHg — AB (ref 32.0–48.0)
PH ART: 7.357 (ref 7.350–7.450)
PO2 ART: 160 mmHg — AB (ref 83.0–108.0)
Patient temperature: 37

## 2016-08-28 LAB — BASIC METABOLIC PANEL
ANION GAP: 6 (ref 5–15)
BUN: 10 mg/dL (ref 6–20)
CO2: 29 mmol/L (ref 22–32)
Calcium: 8.9 mg/dL (ref 8.9–10.3)
Chloride: 105 mmol/L (ref 101–111)
Creatinine, Ser: 0.58 mg/dL — ABNORMAL LOW (ref 0.61–1.24)
GFR calc Af Amer: >60 mL/min (ref >60)
GFR calc non Af Amer: >60 mL/min (ref >60)
GLUCOSE: 108 mg/dL — AB (ref 65–99)
POTASSIUM: 3.4 mmol/L — AB (ref 3.5–5.1)
Sodium: 140 mmol/L (ref 135–145)

## 2016-08-28 LAB — I-STAT TROPONIN, ED: TROPONIN I, POC: 0 ng/mL (ref 0.00–0.08)

## 2016-08-28 MED ORDER — BUDESONIDE 0.25 MG/2ML IN SUSP
0.2500 mg | Freq: Two times a day (BID) | RESPIRATORY_TRACT | Status: DC
  Administered 2016-08-28: 0.25 mg via RESPIRATORY_TRACT
  Filled 2016-08-28 (×5): qty 2

## 2016-08-28 MED ORDER — DEXAMETHASONE SODIUM PHOSPHATE 4 MG/ML IJ SOLN
8.0000 mg | Freq: Once | INTRAMUSCULAR | Status: AC
  Administered 2016-08-28: 8 mg via INTRAVENOUS
  Filled 2016-08-28: qty 2

## 2016-08-28 MED ORDER — RACEPINEPHRINE HCL 2.25 % IN NEBU
0.5000 mL | INHALATION_SOLUTION | Freq: Once | RESPIRATORY_TRACT | Status: AC
  Administered 2016-08-28: 0.5 mL via RESPIRATORY_TRACT
  Filled 2016-08-28: qty 0.5
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
## 2016-08-28 NOTE — ED Notes (Signed)
Gave patient meal tray as requested and ordered by physician.
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
## 2016-09-15 DIAGNOSIS — J386 Stenosis of larynx: Secondary | ICD-10-CM | POA: Insufficient documentation

## 2022-10-25 DIAGNOSIS — N489 Disorder of penis, unspecified: Secondary | ICD-10-CM | POA: Insufficient documentation

## 2022-12-08 ENCOUNTER — Other Ambulatory Visit: Payer: Self-pay

## 2022-12-08 ENCOUNTER — Emergency Department (HOSPITAL_COMMUNITY): Payer: Medicaid Other

## 2022-12-08 ENCOUNTER — Emergency Department (HOSPITAL_COMMUNITY)
Admission: EM | Admit: 2022-12-08 | Discharge: 2022-12-08 | Disposition: A | Discharge status: Home / Self Care | Payer: Medicaid Other | Attending: Emergency Medicine | Admitting: Emergency Medicine

## 2022-12-08 ENCOUNTER — Encounter (HOSPITAL_COMMUNITY): Payer: Self-pay | Admitting: *Deleted

## 2022-12-08 DIAGNOSIS — I861 Scrotal varices: Secondary | ICD-10-CM | POA: Insufficient documentation

## 2022-12-08 DIAGNOSIS — N4340 Spermatocele of epididymis, unspecified: Secondary | ICD-10-CM | POA: Insufficient documentation

## 2022-12-08 DIAGNOSIS — N481 Balanitis: Secondary | ICD-10-CM | POA: Insufficient documentation

## 2022-12-08 DIAGNOSIS — F17210 Nicotine dependence, cigarettes, uncomplicated: Secondary | ICD-10-CM | POA: Diagnosis not present

## 2022-12-08 DIAGNOSIS — N5082 Scrotal pain: Secondary | ICD-10-CM | POA: Diagnosis present

## 2022-12-08 DIAGNOSIS — N433 Hydrocele, unspecified: Secondary | ICD-10-CM | POA: Diagnosis not present

## 2022-12-08 MED ORDER — CLOTRIMAZOLE 1 % EX CREA: TOPICAL_CREAM | CUTANEOUS | 0 refills | Status: DC

## 2022-12-08 NOTE — ED Provider Notes (Signed)
I was asked to do a male GU  exam because the patient felt uncomfortable with a male provider. I was chaperoned by nurse Heath Lark. Patient is an uncircumcised male. Patient got an immediate erection during the exam He has a swollen tender singular nonenlarged lymph node in the right groin Small amount of erythema under the foreskin Darkened macular lesions on the skin of the penis No scrotal lesions, testicles are not swollen or tender   Margarita Mail, PA-C 12/08/22 0828    Jeanell Sparrow, DO 12/15/22 0050

## 2022-12-08 NOTE — Discharge Instructions (Addendum)
Recommend you wear supportive undergarments  Please clean your genital area and foreskin area twice daily.  After cleaning your penis please apply clotrimazole ointment for 7-10 days,   Follow-up with urology on outpatient basis  It was a pleasure caring for you today in the emergency department.  Please return to the emergency department for any worsening or worrisome symptoms.
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
## 2022-12-08 NOTE — ED Triage Notes (Signed)
Pt c/o groin swelling and pain x 1 month. Denies GI/GU issues

## 2022-12-09 ENCOUNTER — Encounter (HOSPITAL_COMMUNITY): Payer: Self-pay | Admitting: Emergency Medicine

## 2023-01-12 ENCOUNTER — Encounter (HOSPITAL_COMMUNITY): Payer: Self-pay

## 2023-01-12 ENCOUNTER — Emergency Department (HOSPITAL_COMMUNITY)
Admission: EM | Admit: 2023-01-12 | Discharge: 2023-01-12 | Discharge status: Against Medical Advice | Payer: Medicaid Other | Attending: Emergency Medicine | Admitting: Emergency Medicine

## 2023-01-12 DIAGNOSIS — R21 Rash and other nonspecific skin eruption: Secondary | ICD-10-CM | POA: Insufficient documentation

## 2023-01-12 DIAGNOSIS — Z5321 Procedure and treatment not carried out due to patient leaving prior to being seen by health care provider: Secondary | ICD-10-CM | POA: Insufficient documentation

## 2023-01-12 HISTORY — DX: Hypothyroidism, unspecified: E03.9
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
## 2023-01-12 NOTE — ED Triage Notes (Signed)
Pt states he has two "lesions" to his penis area. One has been there for one year, and the other popped up one week ago per pt. Pt denies urinary symptoms, no discharge, no foul odor.

## 2023-01-12 NOTE — ED Provider Triage Note (Signed)
Emergency Medicine Provider Triage Evaluation Note  Troy Robbins , a 52 y.o. male  was evaluated in triage.  Pt complains of 2 lesions on his penis area.  He reports 1 has been there for approximately a year and the other lesion popped up 1 week ago.  He reports that does have some discharge and bleeding.  Denies urinary symptoms, penile discharge, foul odor.  He states these lesions are painful.  Review of Systems  Positive: As above Negative: As above  Physical Exam  BP 134/89 (BP Location: Right Arm)   Pulse 76   Temp 98.2 F (36.8 C) (Oral)   Resp 16   Ht '5\' 9"'$  (1.753 m)   Wt 108 kg   SpO2 99%   BMI 35.15 kg/m  Gen:   Awake, no distress   Resp:  Normal effort  MSK:   Moves extremities without difficulty  Other:   Medical Decision Making  Medically screening exam initiated at 12:50 PM.  Appropriate orders placed.  Troy Robbins was informed that the remainder of the evaluation will be completed by another provider, this initial triage assessment does not replace that evaluation, and the importance of remaining in the ED until their evaluation is complete.     Theressa Stamps R, [place] 01/12/23 1251

## 2023-01-31 ENCOUNTER — Ambulatory Visit: Payer: Medicaid Other | Admitting: Gastroenterology

## 2023-02-19 ENCOUNTER — Ambulatory Visit: Payer: Medicaid Other | Admitting: Urology

## 2023-02-20 ENCOUNTER — Ambulatory Visit
Admission: EM | Admit: 2023-02-20 | Discharge: 2023-02-20 | Disposition: A | Discharge status: Home / Self Care | Payer: Medicaid Other | Attending: Family Medicine | Admitting: Family Medicine

## 2023-02-20 DIAGNOSIS — N50811 Right testicular pain: Secondary | ICD-10-CM

## 2023-02-20 DIAGNOSIS — N489 Disorder of penis, unspecified: Secondary | ICD-10-CM | POA: Diagnosis not present

## 2023-02-20 NOTE — ED Triage Notes (Signed)
Pt reports he has a lesion and cyst on his genitals x 1 year. States it comes and goes.      Pt will not provide me with information needed for triage.
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
## 2023-02-20 NOTE — Discharge Instructions (Signed)
Please follow-up with urology regarding the painful cyst you feel in your right scrotum.  Contact information has been provided.  Please call to make an appointment.

## 2023-03-01 ENCOUNTER — Ambulatory Visit: Payer: Medicaid Other

## 2023-04-17 DIAGNOSIS — Z87828 Personal history of other (healed) physical injury and trauma: Secondary | ICD-10-CM | POA: Insufficient documentation

## 2023-04-19 ENCOUNTER — Telehealth: Payer: Self-pay | Admitting: *Deleted

## 2023-04-19 ENCOUNTER — Ambulatory Visit: Payer: Medicaid Other | Admitting: Urology

## 2023-04-19 NOTE — Telephone Encounter (Signed)
   Telephone encounter was:  Unsuccessful.  04/19/2023 Name: Troy Robbins MRN: [id number] DOB: 1971/09/15  Unsuccessful outbound call made today to assist with:   [employer] [facility]  Outreach Attempt:  1st Attempt  Mailbox is full Electronics engineer -Berneda Rose [facility], [facility] [phone] [address] [place] , [place] [address] Email : [email or web address]

## 2023-04-20 ENCOUNTER — Ambulatory Visit (INDEPENDENT_AMBULATORY_CARE_PROVIDER_SITE_OTHER): Payer: Medicaid Other | Admitting: Urology

## 2023-04-20 ENCOUNTER — Other Ambulatory Visit (HOSPITAL_COMMUNITY): Payer: Self-pay

## 2023-04-20 VITALS — BP 136/81 | HR 88 | Ht 69.0 in | Wt 227.0 lb

## 2023-04-20 DIAGNOSIS — N5089 Other specified disorders of the male genital organs: Secondary | ICD-10-CM | POA: Diagnosis not present

## 2023-04-20 DIAGNOSIS — R350 Frequency of micturition: Secondary | ICD-10-CM | POA: Diagnosis not present

## 2023-04-20 LAB — URINALYSIS, ROUTINE W REFLEX MICROSCOPIC
Bilirubin, UA: NEGATIVE
Glucose, UA: NEGATIVE
Ketones, UA: NEGATIVE
Leukocytes,UA: NEGATIVE
Nitrite, UA: NEGATIVE
Protein,UA: NEGATIVE
RBC, UA: NEGATIVE
Specific Gravity, UA: 1.01 (ref 1.005–1.030)
Urobilinogen, Ur: 0.2 mg/dL (ref 0.2–1.0)
pH, UA: 7 (ref 5.0–7.5)

## 2023-04-20 LAB — BLADDER SCAN AMB NON-IMAGING: Scan Result: 122

## 2023-04-20 MED ORDER — SILODOSIN 8 MG PO CAPS: 8.0000 mg | ORAL_CAPSULE | Freq: Every day | ORAL | 11 refills | Status: AC

## 2023-04-20 NOTE — Progress Notes (Cosign Needed Addendum)
post void residual=122 ?
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
## 2023-04-27 ENCOUNTER — Encounter: Payer: Self-pay | Admitting: Urology

## 2023-04-27 NOTE — Patient Instructions (Signed)

## 2023-05-03 ENCOUNTER — Telehealth: Payer: Self-pay

## 2023-05-03 NOTE — Telephone Encounter (Signed)
Urodynamic referral to [facility] Received notice from [facility], they do no accept medicaid insurance.  Message sent to provider.

## 2023-05-08 NOTE — Telephone Encounter (Signed)
Received triage message patient asking about referral status- I returned call to patient to inform patient waiting from MD to determine next step as [facility] does not participate with patient current insurance plan.  Patient voiced understanding

## 2023-05-16 NOTE — Telephone Encounter (Signed)
Patient calling to check on status of next step.

## 2023-05-17 NOTE — Telephone Encounter (Signed)
Dr.McKenzie  Please advise where to refer patient for urodynamics

## 2023-05-22 NOTE — Telephone Encounter (Signed)
Message left on patient voicemail to return call to discuss new referral.  Dr. Wallace Cullens with [facility] will be new referring provider [phone] [address] [place], [place] [address]

## 2023-05-24 ENCOUNTER — Ambulatory Visit: Payer: Medicaid Other | Admitting: Gastroenterology

## 2023-05-28 ENCOUNTER — Encounter: Payer: Self-pay | Admitting: Gastroenterology

## 2023-05-29 NOTE — Telephone Encounter (Signed)
Patient calling back to check on status of referral. Please advise.

## 2023-05-31 NOTE — Telephone Encounter (Signed)
I spoke with patient, he is aware of referral to Dr. Wallace Cullens in [place], [place].  He had concerns as to if they would accept his insurance.  I provided him with the number to Dr. Gwenette Greet office and informed him to contact them with this question.  He will call us back if they will not accept his insurance to request a referral to another provider.

## 2023-06-07 ENCOUNTER — Telehealth: Payer: Self-pay

## 2023-06-07 NOTE — Telephone Encounter (Signed)
Patient left a voice message.  The location he was referred to doesn't take his insurance.  Wanting to know if there's another location he can be sent to?  Please advise.

## 2023-06-07 NOTE — Telephone Encounter (Signed)
Troy Robbins. Are you working on the outgoing referrals while Marchelle Folks is out? If so can you check on this referral please. Thank you!

## 2023-06-08 NOTE — Telephone Encounter (Signed)
Great, thanks

## 2023-07-11 ENCOUNTER — Ambulatory Visit: Payer: Medicaid Other | Admitting: Family Medicine

## 2023-07-13 ENCOUNTER — Other Ambulatory Visit: Payer: Self-pay

## 2023-07-13 ENCOUNTER — Emergency Department (HOSPITAL_COMMUNITY)
Admission: EM | Admit: 2023-07-13 | Discharge: 2023-07-13 | Disposition: A | Discharge status: Home / Self Care | Payer: Medicaid Other | Attending: Emergency Medicine | Admitting: Emergency Medicine

## 2023-07-13 ENCOUNTER — Encounter (HOSPITAL_COMMUNITY): Payer: Self-pay

## 2023-07-13 DIAGNOSIS — N4889 Other specified disorders of penis: Secondary | ICD-10-CM | POA: Diagnosis present

## 2023-07-13 DIAGNOSIS — N489 Disorder of penis, unspecified: Secondary | ICD-10-CM

## 2023-07-13 LAB — RAPID HIV SCREEN (HIV 1/2 AB+AG)
HIV 1/2 Antibodies: NONREACTIVE
HIV-1 P24 Antigen - HIV24: NONREACTIVE

## 2023-07-13 NOTE — ED Triage Notes (Signed)
Pt reports multiple lesions on his penis x 1 month.

## 2023-07-13 NOTE — ED Provider Notes (Signed)
[place] EMERGENCY DEPARTMENT AT [facility] Provider Note   CSN: [id number] Arrival date & time: 07/13/23  1251     History  No chief complaint on file.   Troy Robbins is a 52 y.o. male.  HPI     Home Medications Prior to Admission medications   Medication Sig Start Date End Date Taking? Authorizing Provider  cetirizine (ZYRTEC) 10 MG tablet Take 10 mg by mouth daily as needed.    [provider]  ciclesonide (ALVESCO) 80 MCG/ACT inhaler Inhale into the lungs.    [provider]  ciclopirox (LOPROX) 0.77 % cream Apply topically. 02/16/23   [provider]  clotrimazole-betamethasone (LOTRISONE) cream SMARTSIG:1 Topical Daily 12/19/22   [provider]  coal tar (NEUTROGENA T-GEL) 0.5 % shampoo Apply topically.    [provider]  CONSTULOSE 10 GM/15ML solution SMARTSIG:15 Milliliter(s) By Mouth Daily PRN    [provider]  levothyroxine (SYNTHROID) 137 MCG tablet Take 137 mcg by mouth every morning. 12/29/22   [provider]  mineral oil liquid Take by mouth.    [provider]  naphazoline-pheniramine (NAPHCON-A) 0.025-0.3 % ophthalmic solution 1 drop four (4) times a day.    [provider]  nystatin-triamcinolone ointment (MYCOLOG) Apply topically. 09/14/22 09/14/23  [provider]  pantoprazole (PROTONIX) 20 MG tablet Take 20 mg by mouth 2 (two) times daily.    [provider]  rosuvastatin (CRESTOR) 10 MG tablet Take 10 mg by mouth at bedtime.    [provider]  silodosin (RAPAFLO) 8 MG CAPS capsule Take 1 capsule (8 mg total) by mouth at bedtime. 04/20/23   McKenzie, Mardene Monteen Toops, MD  tamsulosin (FLOMAX) 0.4 MG CAPS capsule 1 capsule.    [provider]  triamcinolone (NASACORT) 55 MCG/ACT AERO nasal inhaler Place into the nose.    [provider]  triamcinolone cream (KENALOG) 0.1 % SMARTSIG:1 Application Topical 2-3 Times Daily 02/14/23    [provider]  VENTOLIN HFA 108 (90 Base) MCG/ACT inhaler Inhale into the lungs.    [provider]      Allergies    Patient has no known allergies.    Review of Systems   Review of Systems  Physical Exam Updated Vital Signs BP 118/82 (BP Location: Right Arm)   Pulse 93   Temp 98.6 F (37 C) (Oral)   Resp 16   Ht 5\' 9"  (1.753 m)   Wt 102.5 kg   SpO2 97%   BMI 33.37 kg/m  Physical Exam Vitals and nursing note reviewed.  Constitutional:      General: He is not in acute distress.    Appearance: He is well-developed.  HENT:     Head: Normocephalic and atraumatic.     Mouth/Throat:     Mouth: Mucous membranes are moist.  Eyes:     Conjunctiva/sclera: Conjunctivae normal.  Cardiovascular:     Rate and Rhythm: Normal rate and regular rhythm.     Heart sounds: No murmur heard. Pulmonary:     Effort: Pulmonary effort is normal. No respiratory distress.     Breath sounds: Normal breath sounds.  Abdominal:     Palpations: Abdomen is soft.     Tenderness: There is no abdominal tenderness.  Musculoskeletal:        General: No swelling.     Cervical back: Neck supple.  Skin:    General: Skin is warm and dry.     Capillary Refill: Capillary refill takes  less than 2 seconds.  Neurological:     General: No focal deficit present.     Mental Status: He is alert and oriented to person, place, and time.  Psychiatric:        Mood and Affect: Mood normal.        ED Results / Procedures / Treatments   Labs (all labs ordered are listed, but only abnormal results are displayed) Labs Reviewed  RPR  RAPID HIV SCREEN (HIV 1/2 AB+AG)  GC/CHLAMYDIA PROBE AMP ([place]) NOT AT [facility]    EKG None  Radiology No results found.  Procedures Procedures    Medications Ordered in ED Medications - No data to display  ED Course/ Medical Decision Making/ A&P                                 Medical Decision Making Dx: Syphilis, tinea corpus, contact  dermatitis, lichenoid dermatitis, other Course: Patient presents with 1 month of penile lesions, the lesions on the glans penis seem to be somewhat ulcerated and are not painful so we will test for syphilis and other STIs, recent GC chlamydia testing was negative.  No herpes like lesions on exam.  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  Patient stated he was feeling concerned about syphilis.  RPR ordered, he did have recent visit for the same rash at PCP and it it was likely fungal in nature and put him on clotrimazole-betamethasone, advised to continue this but await results for syphilis, advised on abstinence until he gets his results and treatment if needed.  Advised condom use.  He is already established with urology           Final Clinical Impression(s) / ED Diagnoses Final diagnoses:  Lesion of penis    Rx / DC Orders ED Discharge Orders     None         Ma Rings, PA-C 07/13/23 1527    Loetta Rough, MD 07/14/23 1527

## 2023-07-13 NOTE — Discharge Instructions (Addendum)
In today for lesions on your penis.  Tested for syphilis today as well as gonorrhea and chlamydia and HIV.  These will be available on your MyChart and you will follow-up with your primary care doctor as well for results.  If they are positive you will need to be treated.  This is also because by fungus, continue fighting the antifungal ointment you are given and follow-up with your primary care doctor and urologist.  Not have sex until you get your results back, make sure you are always using condoms to prevent infection

## 2023-07-14 LAB — RPR: RPR Ser Ql: NONREACTIVE

## 2023-07-16 LAB — GC/CHLAMYDIA PROBE AMP (~~LOC~~) NOT AT ARMC
Chlamydia: NEGATIVE
Comment: NEGATIVE
Comment: NORMAL
Neisseria Gonorrhea: NEGATIVE

## 2023-07-27 ENCOUNTER — Ambulatory Visit: Payer: Self-pay | Admitting: *Deleted

## 2023-07-27 NOTE — Telephone Encounter (Signed)
Summary: Lesion on genitals   Pt called reporting that he has a lesion on his genitals, he denies having testicular pain. No abdominal pain however his stomach is very tight. Says he can go all day without eating and he its still tight.  Best contact: [phone]     Reason for Disposition  [1] Tiny water blisters rash AND [2] 3 or more  Answer Assessment - Initial Assessment Questions 1. SYMPTOM: "What's the main symptom you're concerned about?" (e.g., discharge from penis, rash, pain, itching, swelling)     Open sores on penis 2. LOCATION: "Where is the sore located?"     Same areas-07/13/23 3. ONSET: "When did sores  start?"     2 months 4. PAIN: "Is there any pain?" If Yes, ask: "How bad is it?"  (Scale 1-10; or mild, moderate, severe)     No- itching 5. URINE: "Any difficulty passing urine?" If Yes, ask: "When was the last time?"     Sees urologist- frequency, hard to empty  6. CAUSE: "What do you think is causing the symptoms?"     unsure 7. OTHER SYMPTOMS: "Do you have any other symptoms?" (e.g., fever, abdomen pain, blood in urine)     Abdominal pain- bloating- patient has no appetite.some constipation, urinary symptoms- slow flow  Protocols used: Penis and Scrotum Symptoms-A-AH

## 2023-07-27 NOTE — Telephone Encounter (Signed)
  Chief Complaint: lesions on penis, abdominal bloating Symptoms: lesions on penis- patient seen at ED 9/6 for symptoms- patient reports same- no change- does have new areas- blister and open- no pain, urinary stream is slow and has frequency, abdominal bloating, no appetite Frequency: ongoing- over 1 month Pertinent Negatives: Patient denies pain with urination Disposition: [] ED /[x] Urgent Care (no appt availability in office) / [] Appointment(In office/virtual)/ []  [place] [place] [facility]/ [] Home Care/ [] Refused Recommended Disposition /[] [place] [facility]/ []  Follow-up with PCP Additional Notes: Patient advised see PCP- he states they are 1 month out with appointments. Patient advised options for care: UC, mobile unit. Information given and encouraged evaluation for ongoing symptoms

## 2023-08-02 ENCOUNTER — Telehealth: Payer: Self-pay | Admitting: *Deleted

## 2023-08-02 DIAGNOSIS — Z006 Encounter for examination for normal comparison and control in clinical research program: Secondary | ICD-10-CM

## 2023-08-02 NOTE — Telephone Encounter (Signed)
Patient called to see if he we had any cardiovascular studies he would qualify for at this time. I reviewed his chart but I did not see anything he would be a candidate for at this time. He stated he has chest pain on and off at times. I told him to see his medical doctor for the chest pain.

## 2023-08-09 ENCOUNTER — Ambulatory Visit: Payer: Medicaid Other | Admitting: Family Medicine

## 2023-08-10 ENCOUNTER — Encounter: Payer: Medicaid Other | Admitting: Internal Medicine

## 2023-08-10 NOTE — Progress Notes (Signed)
Erroneous encounter - please disregard.

## 2023-08-13 ENCOUNTER — Encounter: Payer: Self-pay | Admitting: General Practice

## 2023-09-21 ENCOUNTER — Ambulatory Visit: Payer: Medicaid Other | Admitting: Urology

## 2023-09-21 ENCOUNTER — Ambulatory Visit: Payer: Medicaid Other | Attending: Internal Medicine | Admitting: Internal Medicine

## 2023-09-21 NOTE — Progress Notes (Signed)
Erroneous encounter - please disregard.
